# Patient Record
Sex: Male | Born: 1970
Health system: Southern US, Community
[De-identification: ages and names within clinical notes are randomized; demographics above are authoritative.]

## PROBLEM LIST (undated history)

## (undated) DIAGNOSIS — F329 Major depressive disorder, single episode, unspecified: Secondary | ICD-10-CM

## (undated) DIAGNOSIS — M722 Plantar fascial fibromatosis: Secondary | ICD-10-CM

## (undated) DIAGNOSIS — S069X9A Unspecified intracranial injury with loss of consciousness of unspecified duration, initial encounter: Secondary | ICD-10-CM

## (undated) DIAGNOSIS — G43909 Migraine, unspecified, not intractable, without status migrainosus: Secondary | ICD-10-CM

## (undated) DIAGNOSIS — F419 Anxiety disorder, unspecified: Secondary | ICD-10-CM

## (undated) DIAGNOSIS — M5090 Cervical disc disorder, unspecified, unspecified cervical region: Secondary | ICD-10-CM

## (undated) DIAGNOSIS — E785 Hyperlipidemia, unspecified: Secondary | ICD-10-CM

## (undated) DIAGNOSIS — F32A Depression, unspecified: Secondary | ICD-10-CM

## (undated) DIAGNOSIS — G4714 Hypersomnia due to medical condition: Secondary | ICD-10-CM

## (undated) DIAGNOSIS — M4320 Fusion of spine, site unspecified: Secondary | ICD-10-CM

## (undated) HISTORY — DX: Plantar fascial fibromatosis: M72.2

## (undated) HISTORY — DX: Anxiety disorder, unspecified: F41.9

## (undated) HISTORY — DX: Hyperlipidemia, unspecified: E78.5

## (undated) HISTORY — PX: WISDOM TOOTH EXTRACTION: SHX21

## (undated) HISTORY — DX: Major depressive disorder, single episode, unspecified: F32.9

## (undated) HISTORY — PX: FOOT SURGERY: SHX648

## (undated) HISTORY — DX: Migraine, unspecified, not intractable, without status migrainosus: G43.909

## (undated) HISTORY — PX: UMBILICAL HERNIA REPAIR: SHX196

## (undated) HISTORY — DX: Cervical disc disorder, unspecified, unspecified cervical region: M50.90

## (undated) HISTORY — DX: Depression, unspecified: F32.A

## (undated) HISTORY — PX: NECK SURGERY: SHX720

---

## 2013-08-23 ENCOUNTER — Encounter: Payer: Self-pay | Admitting: Physical Medicine & Rehabilitation

## 2013-09-08 ENCOUNTER — Encounter
Payer: BC Managed Care – PPO | Attending: Physical Medicine & Rehabilitation | Admitting: Physical Medicine & Rehabilitation

## 2013-09-08 ENCOUNTER — Encounter: Payer: Self-pay | Admitting: Physical Medicine & Rehabilitation

## 2013-09-08 VITALS — BP 152/87 | HR 74 | Resp 14 | Ht 72.0 in | Wt 162.0 lb

## 2013-09-08 DIAGNOSIS — Z5181 Encounter for therapeutic drug level monitoring: Secondary | ICD-10-CM

## 2013-09-08 DIAGNOSIS — M719 Bursopathy, unspecified: Secondary | ICD-10-CM | POA: Insufficient documentation

## 2013-09-08 DIAGNOSIS — M67919 Unspecified disorder of synovium and tendon, unspecified shoulder: Secondary | ICD-10-CM | POA: Insufficient documentation

## 2013-09-08 DIAGNOSIS — G478 Other sleep disorders: Secondary | ICD-10-CM | POA: Insufficient documentation

## 2013-09-08 DIAGNOSIS — R52 Pain, unspecified: Secondary | ICD-10-CM

## 2013-09-08 DIAGNOSIS — M7520 Bicipital tendinitis, unspecified shoulder: Secondary | ICD-10-CM

## 2013-09-08 DIAGNOSIS — M47812 Spondylosis without myelopathy or radiculopathy, cervical region: Secondary | ICD-10-CM

## 2013-09-08 DIAGNOSIS — M7521 Bicipital tendinitis, right shoulder: Secondary | ICD-10-CM

## 2013-09-08 DIAGNOSIS — Z79899 Other long term (current) drug therapy: Secondary | ICD-10-CM

## 2013-09-08 DIAGNOSIS — M722 Plantar fascial fibromatosis: Secondary | ICD-10-CM

## 2013-09-08 MED ORDER — DICLOFENAC SODIUM 1 % TD GEL: 1.0000 "application " | Freq: Four times a day (QID) | TRANSDERMAL | Status: DC
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
## 2013-09-08 NOTE — Patient Instructions (Signed)
I need your xray and MRI reports from [facility]  Once we receive back your urine results I will write your pain medication.    Biceps Tendon Tendinitis (Proximal) and Tenosynovitis with Rehab Tendonitis and tenosynovitis involve inflammation of the tendon and the tendon lining (sheath). The proximal biceps tendon is vulnerable to tendonitis and tenosynovitis, which causes pain and discomfort in the front of the shoulder and upper arm. The tendon lining secretes a fluid that helps lubricate the tendon, allowing for proper function without pain. When the tendon and its lining become inflamed, the tendon can no longer glide smoothly, causing pain. The proximal biceps tendon connects the biceps muscle to two bones of the shoulder. It is important for proper function of the elbow and turning the palm upward (supination) using the wrist. Proximal biceps tendon tendinitis may include a grade 1 or 2 strain of the tendon. Grade 1 strains involve a slight pull of the tendon without signs of tearing and no observed tendon lengthening. There is also no loss of strength. Grade 2 strains involve small tears in the tendon fibers. The tendon or muscle is stretched and strength is usually decreased.  SYMPTOMS   Pain, tenderness, swelling, warmth, or redness over the front of the shoulder.  Pain that gets worse with shoulder and elbow use, especially against resistance.  Limited motion of the shoulder or elbow.  Crackling sound (crepitation) when the tendon or shoulder is moved or touched. CAUSES  The symptoms of biceps tendonitis are due to inflammation of the tendon. Inflammation may be caused by:  Strain from sudden increase in amount or intensity of activity.  Direct blow or injury to the elbow (uncommon).  Overuse or repetitive elbow bending or wrist rotation, particularly when turning the palm up, or with elbow hyperextension. RISK INCREASES WITH:  Sports that involve contact or overhead arm  activity (throwing sports, gymnastics, weightlifting, bodybuilding, rock climbing).  Heavy labor.  Poor strength and flexibility.  Failure to warm up properly before activity. PREVENTION  Warm up and stretch properly before activity.  Allow time for recovery between activities.  Maintain physical fitness:  Strength, flexibility, and endurance.  Cardiovascular fitness.  Learn and use proper exercise technique. PROGNOSIS  With proper treatment, proximal biceps tendon tendonitis and tenosynovitis is usually curable within 6 weeks. Healing is usually quicker if the cause was a direct blow, not overuse.  RELATED COMPLICATIONS   Longer healing time if not properly treated or if not given enough time to heal.  Chronically inflamed tendon that causes persistent pain with activity, that may progress to constant pain and potentially rupture of the tendon.  Recurring symptoms, especially if activity is resumed too soon or with overuse, a direct blow, or use of poor exercise technique. TREATMENT Treatment first involves ice and medicine, to reduce pain and inflammation. It is helpful to modify activities that cause pain, to reduce the chances of causing the condition to get worse. Strengthening and stretching exercises should be performed to promote proper use of the muscles of the shoulder. These exercises may be performed at home or with a therapist. Other treatments may be given such as ultrasound or heat therapy. A corticosteroid injection may be recommended to help reduce inflammation of the tendon lining. Surgery is usually not necessary. Sometimes, if symptoms last for greater than 6 months, surgery will be advised to detach the tendon and re-insert it into the arm bone. Surgery to correct other shoulder problems that may be contributing to tendinitis may be  advised before surgery for the tendinitis itself.  MEDICATION  If pain medicine is needed, nonsteroidal anti-inflammatory medicines  (aspirin and ibuprofen), or other minor pain relievers (acetaminophen), are often advised.  Do not take pain medicine for 7 days before surgery.  Prescription pain relievers may be given if your caregiver thinks they are needed. Use only as directed and only as much as you need.  Corticosteroid injections may be given. These injections should only be used on the most severe cases, as one can only receive a limited number of them. HEAT AND COLD   Cold treatment (icing) should be applied for 10 to 15 minutes every 2 to 3 hours for inflammation and pain, and immediately after activity that aggravates your symptoms. Use ice packs or an ice massage.  Heat treatment may be used before performing stretching and strengthening activities prescribed by your caregiver, physical therapist, or athletic trainer. Use a heat pack or a warm water soak. SEEK MEDICAL CARE IF:   Symptoms get worse or do not improve in 2 weeks, despite treatment.  New, unexplained symptoms develop. (Drugs used in treatment may produce side effects.) EXERCISES RANGE OF MOTION (ROM) AND EXERCISES - Biceps Tendon (Proximal) and Tenosynovitis These exercises may help you when beginning to rehabilitate your injury. Your symptoms may go away with or without further involvement from your physician, physical therapist, or athletic trainer. While completing these exercises, remember:   Restoring tissue flexibility helps normal motion to return to the joints. This allows healthier, less painful movement and activity.  An effective stretch should be held for at least 30 seconds.  A stretch should never be painful. You should only feel a gentle lengthening or release in the stretched tissue. STRETCH  Flexion, Standing  Stand with good posture. With an underhand grip on your right / left hand and an overhand grip on the opposite hand, grasp a broomstick or cane so that your hands are a little more than shoulder width apart.  Keeping  your right / left elbow straight and shoulder muscles relaxed, push the stick with your opposite hand to raise your right / left arm in front of your body and then overhead. Raise your arm until you feel a stretch in your right / left shoulder, but before you have increased shoulder pain.  Try to avoid shrugging your right / left shoulder as your arm rises, by keeping your shoulder blade tucked down and toward your mid-back spine. Hold for __________ seconds.  Slowly return to the starting position. Repeat __________ times. Complete this exercise __________ times per day. STRETCH  Abduction, Supine  Lie on your back. With an underhand grip on your right / left hand and an overhand grip on the opposite hand, grasp a broomstick or cane so that your hands are a little more than shoulder width apart.  Keeping your right / left elbow straight and shoulder muscles relaxed, push the stick with your opposite hand to raise your right / left arm out to the side of your body and then overhead. Raise your arm until you feel a stretch in your right / left shoulder, but before you have increased shoulder pain.  Try to avoid shrugging your right / left shoulder as your arm rises, by keeping your shoulder blade tucked down and toward your mid-back spine. Hold for __________ seconds.  Slowly return to the starting position. Repeat __________ times. Complete this exercise __________ times per day. ROM  Flexion, Active-Assisted  Lie on your back. You  may bend your knees for comfort.  Grasp a broomstick or cane so your hands are about shoulder width apart. Your right / left hand should grip the end of the stick so that your hand is positioned "thumbs-up," as if you were about to shake hands.  Using your healthy arm to lead, raise your right / left arm overhead until you feel a gentle stretch in your shoulder. Hold for __________ seconds.  Use the stick to assist in returning your right / left arm to its starting  position. Repeat __________ times. Complete this exercise __________ times per day.  STRETCH  Flexion, Standing   Stand facing a wall. Walk your right / left fingers up the wall until you feel a moderate stretch in your shoulder. As your hand gets higher, you may need to step closer to the wall or use a door frame to walk through.  Try to avoid shrugging your right / left shoulder as your arm rises, by keeping your shoulder blade tucked down and toward your mid-back spine.  Hold for __________ seconds. Use your other hand, if needed, to ease out of the stretch and return to the starting position. Repeat __________ times. Complete this exercise __________ times per day.  ROM - Internal Rotation   Using underhand grips, grasp a stick behind your back with both hands.  While standing upright with good posture, slide the stick up your back until you feel a mild stretch in the front of your shoulder.  Hold for __________ seconds. Slowly return to your starting position. Repeat __________ times. Complete this exercise __________ times per day.  STRETCH - Internal Rotation  Place your right / left hand behind your back, palm-up.  Throw a towel or belt over your opposite shoulder. Grasp the towel with your right / left hand.  While keeping an upright posture, gently pull up on the towel until you feel a stretch in the front of your right / left shoulder.  Avoid shrugging your right / left shoulder as your arm rises, by keeping your shoulder blade tucked down and toward your mid-back spine.  Hold for __________ seconds. Release the stretch by lowering your opposite hand. Repeat __________ times. Complete this exercise __________ times per day. STRENGTHENING EXERCISES - Biceps Tendon Tendinitis (Proximal) and Tenosynovitis These exercises may help you regain your strength after your physician has discontinued your restraint in a cast or brace. They may resolve your symptoms with or without  further involvement from your physician, physical therapist or athletic trainer. While completing these exercises, remember:   Muscles can gain both the endurance and the strength needed for everyday activities through controlled exercises.  Complete these exercises as instructed by your physician, physical therapist or athletic trainer. Increase the resistance and repetitions only as guided.  You may experience muscle soreness or fatigue, but the pain or discomfort you are trying to eliminate should never worsen during these exercises. If this pain does get worse, stop and make sure you are following the directions exactly. If the pain is still present after adjustments, discontinue the exercise until you can discuss the trouble with your caregiver. STRENGTH - Elbow Flexors, Isometric  Stand or sit upright on a firm surface. Place your right / left arm so that your hand is palm-up and at the height of your waist.  Place your opposite hand on top of your forearm. Gently push down as your right / left arm resists. Push as hard as you can with both arms,  without causing any pain or movement at your right / left elbow. Hold this stationary position for __________ seconds.  Gradually release the tension in both arms. Allow your muscles to relax completely before repeating. Repeat __________ times. Complete this exercise __________ times per day. STRENGTH - Shoulder Flexion, Isometric  With good posture and facing a wall, stand or sit about 4-6 inches away.  Keeping your right / left elbow straight, gently press the top of your fist into the wall. Increase the pressure gradually until you are pressing as hard as you can, without shrugging your shoulder or increasing any shoulder discomfort.  Hold for __________ seconds.  Release the tension slowly. Relax your shoulder muscles completely before you start the next repetition. Repeat __________ times. Complete this exercise __________ times per day.    STRENGTH  Elbow Flexors, Supinated  With good posture, stand or sit on a firm chair without armrests. Allow your right / left arm to rest at your side with your palm facing forward.  Holding a __________ weight, or gripping a rubber exercise band or tubing,  bring your hand toward your shoulder.  Allow your muscles to control the resistance as your hand returns to your side. Repeat __________ times. Complete this exercise __________ times per day.  STRENGTH - Shoulder Flexion  Stand or sit with good posture. Grasp a __________ weight, or an exercise band or tubing, so that your hand is "thumbs-up," like when you shake hands.  Slowly lift your right / left arm as far as you can, without increasing any shoulder pain. At first, many people can only raise their hand to shoulder height.  Avoid shrugging your right / left shoulder as your arm rises, by keeping your shoulder blade tucked down and toward your mid-back spine.  Hold for __________ seconds. Control the descent of your hand as you slowly return to your starting position. Repeat __________ times. Complete this exercise __________ times per day. Document Released: 10/28/2005 Document Revised: 01/20/2012 Document Reviewed: 02/09/2009 [facility] Patient Information 2014 [place], [place].

## 2013-09-13 ENCOUNTER — Telehealth: Payer: Self-pay

## 2013-09-13 DIAGNOSIS — M7521 Bicipital tendinitis, right shoulder: Secondary | ICD-10-CM

## 2013-09-13 DIAGNOSIS — M47812 Spondylosis without myelopathy or radiculopathy, cervical region: Secondary | ICD-10-CM

## 2013-09-13 DIAGNOSIS — M722 Plantar fascial fibromatosis: Secondary | ICD-10-CM

## 2013-09-13 NOTE — Telephone Encounter (Signed)
Not back yet. 

## 2013-09-13 NOTE — Telephone Encounter (Signed)
Patient called and wanted to know if his UDS had came back yet and if Dr. Riley Kill could start prescribing his pain medication.

## 2013-09-14 MED ORDER — HYDROCODONE-ACETAMINOPHEN 5-325 MG PO TABS: 1.0000 | ORAL_TABLET | Freq: Three times a day (TID) | ORAL | Status: DC | PRN

## 2013-09-14 NOTE — Telephone Encounter (Signed)
Left patient a voicemail to return call to clinic to let him know his UDS has not came back yet.

## 2013-09-14 NOTE — Telephone Encounter (Signed)
rx written. He may pick up

## 2013-09-14 NOTE — Telephone Encounter (Signed)
Patient called on 11/3 and wanted to know if his UDS was back so that treatment could start. His UDS results just came in which were consistent.  Patient has been out of medication since Saturday. Please advise.

## 2013-09-14 NOTE — Telephone Encounter (Signed)
Patient is aware RX is ready for pick-up. °

## 2013-10-18 ENCOUNTER — Encounter: Payer: Self-pay | Admitting: Physical Medicine & Rehabilitation

## 2013-10-18 ENCOUNTER — Encounter
Payer: BC Managed Care – PPO | Attending: Physical Medicine & Rehabilitation | Admitting: Physical Medicine & Rehabilitation

## 2013-10-18 ENCOUNTER — Ambulatory Visit (HOSPITAL_COMMUNITY)
Admission: RE | Admit: 2013-10-18 | Discharge: 2013-10-18 | Disposition: A | Discharge status: Home / Self Care | Payer: BC Managed Care – PPO | Source: Ambulatory Visit | Attending: Physical Medicine & Rehabilitation | Admitting: Physical Medicine & Rehabilitation

## 2013-10-18 ENCOUNTER — Telehealth: Payer: Self-pay | Admitting: Physical Medicine & Rehabilitation

## 2013-10-18 VITALS — BP 164/86 | HR 87 | Resp 14 | Ht 71.0 in | Wt 172.0 lb

## 2013-10-18 DIAGNOSIS — M7521 Bicipital tendinitis, right shoulder: Secondary | ICD-10-CM

## 2013-10-18 DIAGNOSIS — M722 Plantar fascial fibromatosis: Secondary | ICD-10-CM | POA: Insufficient documentation

## 2013-10-18 DIAGNOSIS — M47812 Spondylosis without myelopathy or radiculopathy, cervical region: Secondary | ICD-10-CM

## 2013-10-18 DIAGNOSIS — M5030 Other cervical disc degeneration, unspecified cervical region: Secondary | ICD-10-CM | POA: Insufficient documentation

## 2013-10-18 DIAGNOSIS — M7520 Bicipital tendinitis, unspecified shoulder: Secondary | ICD-10-CM | POA: Insufficient documentation

## 2013-10-18 MED ORDER — FENTANYL 12 MCG/HR TD PT72: 12.5000 ug | MEDICATED_PATCH | TRANSDERMAL | Status: DC

## 2013-10-18 MED ORDER — TIZANIDINE HCL 2 MG PO CAPS: 2.0000 mg | ORAL_CAPSULE | Freq: Three times a day (TID) | ORAL | Status: DC | PRN

## 2013-10-18 MED ORDER — HYDROCODONE-ACETAMINOPHEN 5-325 MG PO TABS: 1.0000 | ORAL_TABLET | Freq: Three times a day (TID) | ORAL | Status: DC | PRN

## 2013-10-18 NOTE — Patient Instructions (Signed)
CALL ME WITH ANY PROBLEMS OR QUESTIONS (#[phone]).    HAPPY HOLIDAYS!!!!!   

## 2013-10-18 NOTE — Telephone Encounter (Signed)
Please let pt know there is mild facet arthritis on the xray. I would like him to proceed with his home traction if he still feels familiar with the use of his traction collar. i recommend 2 treatments per day 20-30 minutes per treatment. thanks

## 2013-10-18 NOTE — Progress Notes (Signed)
Subjective:    Patient ID: Alexander Douglas, male    DOB: 1971-09-10, 42 y.o.   MRN: [id number]  HPI  Alexander Douglas is back regarding his chronic pain. Pain levels have been fairly consistent. He checked at [facility] and they have "purged" his cervical images.   His feet have been stable but his neck has been worsening. The neck pain is more consistent. It bothers him a lot when he sleeps. He has developed worsening headaches as a result. He has tried ice and heat. Nothing really seems to help it.   His feet bother him more when he's working and up on them.     Pain Inventory Average Pain 7 Pain Right Now 8 My pain is sharp, burning, dull, stabbing, tingling and aching  In the last 24 hours, has pain interfered with the following? General activity 7 Relation with others 8 Enjoyment of life 9 What TIME of day is your pain at its worst? constant all day Sleep (in general) Poor  Pain is worse with: walking, bending, sitting, standing, some activites and sleeping Pain improves with: rest and medication Relief from Meds: 6  Mobility walk without assistance how many minutes can you walk? unlimited but hurts ability to climb steps?  yes do you drive?  yes transfers alone Do you have any goals in this area?  yes  Function employed # of hrs/week 12  Neuro/Psych spasms anxiety  Prior Studies Any changes since last visit?  no  Physicians involved in your care Any changes since last visit?  no   Family History  Problem Relation Age of Onset  . Diabetes Mother   . Heart disease Mother   . Hyperlipidemia Mother   . Hypertension Mother    History   Social History  . Marital Status: Unknown    Spouse Name: N/A    Number of Children: N/A  . Years of Education: N/A   Social History Main Topics  . Smoking status: Never Smoker   . Smokeless tobacco: Current User  . Alcohol Use: Yes  . Drug Use: No  . Sexual Activity: None   Other Topics Concern  . None   Social  History Narrative  . None   Past Surgical History  Procedure Laterality Date  . Foot surgery      Left 8/12 right 10/12  . Wisdom tooth extraction     Past Medical History  Diagnosis Date  . Hyperlipidemia   . Depression    BP 164/86  Pulse 87  Resp 14  Ht 5\' 11"  (1.803 m)  Wt 172 lb (78.019 kg)  BMI 24.00 kg/m2  SpO2 99%     Review of Systems  Constitutional: Positive for diaphoresis.  Neurological:       Spasms   Psychiatric/Behavioral: The patient is nervous/anxious.   All other systems reviewed and are negative.       Objective:   Physical Exam  General: Alert and oriented x 3, No apparent distress  HEENT: Head is normocephalic, atraumatic, PERRLA, EOMI, sclera anicteric, oral mucosa pink and moist, dentition intact, ext ear canals clear,  Neck: Supple without JVD or lymphadenopathy  Heart: Reg rate and rhythm. No murmurs rubs or gallops  Chest: CTA bilaterally without wheezes, rales, or rhonchi; no distress  Abdomen: Soft, non-tender, non-distended, bowel sounds positive.  Extremities: No clubbing, cyanosis, or edema. Pulses are 2+  Skin: Clean and intact without signs of breakdown  Neuro: Pt is cognitively appropriate with normal insight, memory, and awareness.  Cranial nerves 2-12 are intact. Sensory exam is normal. Reflexes are 2+ in all 4's. Fine motor coordination is intact. No tremors. Motor function is grossly 5/5.  Musculoskeletal: Pain in the right shoulder with ER.  He had pain with extension of the neck and with facet maneuvers bilaterally. Today, left more than right. . Lateral Flexion caused some tightness and pain  There was crepitus at the neck and some popping with palpation around the c5 spinous process.. Both medial arches were tender with scarring noted along the flexor tendons and arch. Both were somewhat tender to touch, left more than right. His arch was relatively preserved with gait.     Psych: Pt's affect is appropriate. Pt is cooperative      Assessment & Plan:   1. Chronic plantar fasciitis with plantar fibroids  2. Right biceps tendonitis, labral injury  3. Cervical spondylosis, ?facet arthopathy  4. Shift sleep disorder   Plan:  1. continue voltaren gel to both feet, right shoulder  2. Will xray the neck to assess disk spaces and facets.  3. Will add fentanyl for better pain control. He may continue with the hydrocodone for breakthrough. 4. Continue exercising. Will potentially use traction unit he has pending xray results.  5. Will change robaxin to zanaflex to help with sleep and neck spasm.  6. Prn naproxen  7. Follow up with me in one month. 30 minutes of face to face patient care time were spent during this visit. All questions were encouraged and answered.

## 2013-10-19 NOTE — Telephone Encounter (Signed)
Left patient a voicemail to return call to clinic regarding X-Ray.

## 2013-10-20 ENCOUNTER — Telehealth: Payer: Self-pay

## 2013-10-20 NOTE — Telephone Encounter (Signed)
Returned call to clinic. Left patient another voicemail to return call.

## 2013-10-21 NOTE — Telephone Encounter (Signed)
Informed patient of X-Ray results. 

## 2013-11-05 ENCOUNTER — Telehealth: Payer: Self-pay

## 2013-11-05 NOTE — Telephone Encounter (Signed)
Patient called because he will be out of his medication before his next appointment.

## 2013-11-08 NOTE — Telephone Encounter (Signed)
Left a voicemail to return call to clinic to see if patient to see if he could come in to be seen today since he will be out of medication before his appt.

## 2013-11-09 ENCOUNTER — Encounter: Payer: Self-pay | Admitting: Physical Medicine & Rehabilitation

## 2013-11-09 ENCOUNTER — Encounter
Payer: BC Managed Care – PPO | Attending: Physical Medicine & Rehabilitation | Admitting: Physical Medicine & Rehabilitation

## 2013-11-09 VITALS — BP 140/84 | HR 93 | Resp 14 | Ht 71.0 in | Wt 167.0 lb

## 2013-11-09 DIAGNOSIS — M7521 Bicipital tendinitis, right shoulder: Secondary | ICD-10-CM

## 2013-11-09 DIAGNOSIS — M719 Bursopathy, unspecified: Secondary | ICD-10-CM | POA: Insufficient documentation

## 2013-11-09 DIAGNOSIS — M67919 Unspecified disorder of synovium and tendon, unspecified shoulder: Secondary | ICD-10-CM | POA: Insufficient documentation

## 2013-11-09 DIAGNOSIS — G4726 Circadian rhythm sleep disorder, shift work type: Secondary | ICD-10-CM | POA: Insufficient documentation

## 2013-11-09 DIAGNOSIS — D2370 Other benign neoplasm of skin of unspecified lower limb, including hip: Secondary | ICD-10-CM | POA: Insufficient documentation

## 2013-11-09 DIAGNOSIS — M722 Plantar fascial fibromatosis: Secondary | ICD-10-CM | POA: Insufficient documentation

## 2013-11-09 DIAGNOSIS — M7520 Bicipital tendinitis, unspecified shoulder: Secondary | ICD-10-CM

## 2013-11-09 DIAGNOSIS — M47812 Spondylosis without myelopathy or radiculopathy, cervical region: Secondary | ICD-10-CM | POA: Insufficient documentation

## 2013-11-09 MED ORDER — FENTANYL 25 MCG/HR TD PT72: 25.0000 ug | MEDICATED_PATCH | TRANSDERMAL | Status: DC

## 2013-11-09 MED ORDER — HYDROCODONE-ACETAMINOPHEN 5-325 MG PO TABS: 1.0000 | ORAL_TABLET | Freq: Three times a day (TID) | ORAL | Status: DC | PRN

## 2013-11-09 NOTE — Patient Instructions (Signed)
TRY USING YOUR TRACTION ABOUT PER DAY.    CONTINUE TO WORK ON YOUR POSTURE AND HEAD/NECK POSITION.

## 2013-11-09 NOTE — Progress Notes (Signed)
Subjective:    Patient ID: Alexander Douglas, male    DOB: 01-29-1971, 42 y.o.   MRN: [id number]  HPI  Mr. Mckibben is back regarding his chronic foot and neck pain. He has felt that the fentanyl has been helpful but the relief hasn't been the entire 72 hours---often pain relief was only a day to 1.5 days. He has started using his cervical traction unit which has helped to a degree. The zanaflex has been much better for sleep and muscle relaxation at night as opposed to the robaxin. Alleve is benefical.   His cervical xrays showed: Mild foraminal narrowing bilaterally at C4-5 related to facet  hypertrophy. Mild anterior slip C4-5 related facet degeneration.  Negative for spondylosis or fracture.    Pain Inventory Average Pain 6 Pain Right Now 6 My pain is sharp, dull, stabbing, tingling and aching  In the last 24 hours, has pain interfered with the following? General activity 6 Relation with others 7 Enjoyment of life 8 What TIME of day is your pain at its worst? all the time Sleep (in general) Fair  Pain is worse with: standing and some activites Pain improves with: heat/ice, therapy/exercise and medication Relief from Meds: 5  Mobility walk without assistance ability to climb steps?  yes do you drive?  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  no   Family History  Problem Relation Age of Onset  . Diabetes Mother   . Heart disease Mother   . Hyperlipidemia Mother   . Hypertension Mother    History   Social History  . Marital Status: Unknown    Spouse Name: N/A    Number of Children: N/A  . Years of Education: N/A   Social History Main Topics  . Smoking status: Never Smoker   . Smokeless tobacco: Current User  . Alcohol Use: Yes  . Drug Use: No  . Sexual Activity: None   Other Topics Concern  .  None   Social History Narrative  . None   Past Surgical History  Procedure Laterality Date  . Foot surgery      Left 8/12 right 10/12  . Wisdom tooth extraction     Past Medical History  Diagnosis Date  . Hyperlipidemia   . Depression    BP 140/84  Pulse 93  Resp 14  Ht 5\' 11"  (1.803 m)  Wt 167 lb (75.751 kg)  BMI 23.30 kg/m2  SpO2 100%     Review of Systems  Constitutional: Positive for unexpected weight change.  Musculoskeletal: Positive for arthralgias and myalgias.  Neurological: Positive for numbness.  Psychiatric/Behavioral: The patient is nervous/anxious.   All other systems reviewed and are negative.       Objective:   Physical Exam  General: Alert and oriented x 3, No apparent distress  HEENT: Head is normocephalic, atraumatic, PERRLA, EOMI, sclera anicteric, oral mucosa pink and moist, dentition intact, ext ear canals clear,  Neck: Supple without JVD or lymphadenopathy  Heart: Reg rate and rhythm. No murmurs rubs or gallops  Chest: CTA bilaterally without wheezes, rales, or rhonchi; no distress  Abdomen: Soft, non-tender, non-distended, bowel sounds positive.  Extremities: No clubbing, cyanosis, or edema. Pulses are 2+  Skin: Clean and intact without signs of breakdown  Neuro: Pt is cognitively appropriate with normal insight, memory, and awareness. Cranial nerves 2-12 are intact. Sensory  exam is normal. Reflexes are 2+ in all 4's. Fine motor coordination is intact. No tremors. Motor function is grossly 5/5.  Musculoskeletal: Posture was better today. Has less of a head forward position. Cervical musclature less tense although there still is some tightness in the mid traps. Less crepitus and pain with palpation today. Shoulders in better position. Both medial arches remain tender with scarring noted along the flexor tendons and arch. Both were somewhat tender to touch, left more than right. His arch was relatively preserved with gait.  Psych: Pt's affect is  appropriate. Pt is cooperative   Assessment & Plan:   1. Chronic plantar fasciitis with plantar fibroids  2. Right biceps tendonitis, labral injury  3. Cervical spondylosis, ?facet arthopathy  4. Shift sleep disorder    Plan:  1. continue voltaren gel to both feet, right shoulder  2. Encouraged the use of his own traction unit, perhaps going up to 30 minutes per day.  3. Will increase fentanyl to to see if he has better efficacy 4. Reviewed posture and ongoing maintenance activities, rest breaks at work, etc.  5. Continue with zanaflex before sleep. Consider use during the day.   6. Prn naproxen  7. Follow up with me in one month. 30 minutes of face to face patient care time were spent during this visit. All questions were encouraged and answered.

## 2013-11-24 ENCOUNTER — Ambulatory Visit: Payer: BC Managed Care – PPO | Admitting: Physical Medicine & Rehabilitation

## 2013-11-24 ENCOUNTER — Telehealth: Payer: Self-pay

## 2013-11-24 NOTE — Telephone Encounter (Signed)
Patient said he is having a lot of neck pain due to work, so he was wondering if he could use his TENS unit?

## 2013-11-24 NOTE — Telephone Encounter (Signed)
That would be fine 

## 2013-11-25 NOTE — Telephone Encounter (Signed)
Left patient a voicemail informing him that its was okay to use his TENS unit per Dr. Naaman Plummer and if he had any questions to contact the clinic.

## 2013-12-08 ENCOUNTER — Encounter: Payer: Self-pay | Admitting: Physical Medicine & Rehabilitation

## 2013-12-08 ENCOUNTER — Encounter
Payer: BC Managed Care – PPO | Attending: Physical Medicine & Rehabilitation | Admitting: Physical Medicine & Rehabilitation

## 2013-12-08 VITALS — BP 157/93 | HR 71 | Resp 14 | Ht 71.0 in | Wt 170.0 lb

## 2013-12-08 DIAGNOSIS — M47812 Spondylosis without myelopathy or radiculopathy, cervical region: Secondary | ICD-10-CM

## 2013-12-08 DIAGNOSIS — M7520 Bicipital tendinitis, unspecified shoulder: Secondary | ICD-10-CM

## 2013-12-08 DIAGNOSIS — IMO0001 Reserved for inherently not codable concepts without codable children: Secondary | ICD-10-CM

## 2013-12-08 DIAGNOSIS — M7918 Myalgia, other site: Secondary | ICD-10-CM | POA: Insufficient documentation

## 2013-12-08 DIAGNOSIS — M722 Plantar fascial fibromatosis: Secondary | ICD-10-CM

## 2013-12-08 DIAGNOSIS — M7521 Bicipital tendinitis, right shoulder: Secondary | ICD-10-CM

## 2013-12-08 MED ORDER — FENTANYL 25 MCG/HR TD PT72: 25.0000 ug | MEDICATED_PATCH | TRANSDERMAL | Status: DC

## 2013-12-08 MED ORDER — HYDROCODONE-ACETAMINOPHEN 5-325 MG PO TABS: 1.0000 | ORAL_TABLET | Freq: Three times a day (TID) | ORAL | Status: DC | PRN

## 2013-12-08 NOTE — Progress Notes (Signed)
Subjective:    Patient ID: Alexander Douglas, male    DOB: 13-Jul-1971, 43 y.o.   MRN: [id number]  HPI  Alexander Douglas is back regarding his multiple pain complaints. He is working 50 hours per week currently and has been doing a lot of overhead work, standing on ladders, etc.  He has been using his collar daily basis, typically 20-30 minutes per day, over the last month. He feels that it helps although it's a little painful at first when it's on his neck. He's found that his TENS unit is helpful as well.    He's tried some boots more recently which seemed to help his feet.    Pain Inventory Average Pain 6 Pain Right Now 8 My pain is sharp, burning, dull, stabbing, aching and other  In the last 24 hours, has pain interfered with the following? General activity 6 Relation with others 7 Enjoyment of life 9 What TIME of day is your pain at its worst? morning, evening, night Sleep (in general) Poor  Pain is worse with: walking, bending, inactivity, standing, some activites and other Pain improves with: rest, heat/ice, therapy/exercise, pacing activities, medication and TENS Relief from Meds: 6  Mobility walk without assistance how many minutes can you walk? a lot with pain though ability to climb steps?  yes do you drive?  yes  Function employed # of hrs/week 50+ I need assistance with the following:  household duties Do you have any goals in this area?  yes  Neuro/Psych numbness trouble walking spasms anxiety  Prior Studies Any changes since last visit?  yes x-rays  Physicians involved in your care Any changes since last visit?  no   Family History  Problem Relation Age of Onset  . Diabetes Mother   . Heart disease Mother   . Hyperlipidemia Mother   . Hypertension Mother    History   Social History  . Marital Status: Unknown    Spouse Name: N/A    Number of Children: N/A  . Years of Education: N/A   Social History Main Topics  . Smoking status: Never Smoker     . Smokeless tobacco: Current User  . Alcohol Use: Yes  . Drug Use: No  . Sexual Activity: None   Other Topics Concern  . None   Social History Narrative  . None   Past Surgical History  Procedure Laterality Date  . Foot surgery      Left 8/12 right 10/12  . Wisdom tooth extraction     Past Medical History  Diagnosis Date  . Hyperlipidemia   . Depression    BP 157/93  Pulse 71  Resp 14  Ht 5\' 11"  (1.803 m)  Wt 170 lb (77.111 kg)  BMI 23.72 kg/m2  SpO2 100%  Opioid Risk Score: 5 Fall Risk Score:  (pt educated on fall risk, brochure given to pt.)    Review of Systems  Constitutional: Positive for diaphoresis.  Musculoskeletal: Positive for gait problem and neck pain.  Neurological: Positive for numbness.       Spasms  Psychiatric/Behavioral: The patient is nervous/anxious.   All other systems reviewed and are negative.       Objective:   Physical Exam  General: Alert and oriented x 3, No apparent distress  HEENT: Head is normocephalic, atraumatic, PERRLA, EOMI, sclera anicteric, oral mucosa pink and moist, dentition intact, ext ear canals clear,  Neck: Supple without JVD or lymphadenopathy  Heart: Reg rate and rhythm. No murmurs rubs or gallops  Chest: CTA bilaterally without wheezes, rales, or rhonchi; no distress  Abdomen: Soft, non-tender, non-distended, bowel sounds positive.  Extremities: No clubbing, cyanosis, or edema. Pulses are 2+  Skin: Clean and intact without signs of breakdown  Neuro: Pt is cognitively appropriate with normal insight, memory, and awareness. Cranial nerves 2-12 are intact. Sensory exam is normal. Reflexes are 2+ in all 4's. Fine motor coordination is intact. No tremors. Motor function is grossly 5/5.  Musculoskeletal: Posture was still in a head forward position. Cervical musclature less tense. Shoulders are rotated internally quite a bit today.   . Both medial arches remain tender with scarring noted along the flexor tendons and  arch. Both were somewhat tender to touch, left more than right. His arch was relatively preserved with gait.  Psych: Pt's affect is appropriate. Pt is cooperative    Assessment & Plan:   1. Chronic plantar fasciitis with plantar fibroids  2. Right biceps tendonitis, labral injury  3. Cervical spondylosis, ?facet arthopathy  4. Shift sleep disorder   Plan:  1. Continue voltaren gel to both feet, right shoulder  2. Encouraged the use of his own traction unit at home. May benefit from using his zanaflex before. He's not real anxious to pursue any injections for his neck. 3. Continue fentanyl to 71mcg and hydrocodone. rx'es were provide for this and next month.    4. Needs to continue working on posture and regular stretching, technique. I believe this is his best option. He tends to fall into bad habits especially when he's tired.   5. Continue with zanaflex before sleep. Consider use during the day--we discussed this again today.  6. Naproxen 2 tabs BID with food. 7. Follow up with me in 2 months. 30 minutes of face to face patient care time were spent during this visit. All questions were encouraged and answered.            Assessment & Plan:

## 2013-12-08 NOTE — Patient Instructions (Signed)
CONTINUE TO WORK ON YOUR POSTURE, STRETCHING  TAKE YOUR ZANAFLEX A LITTLE MORE OFTEN

## 2014-02-01 ENCOUNTER — Encounter: Payer: BC Managed Care – PPO | Admitting: Physical Medicine & Rehabilitation

## 2014-02-02 ENCOUNTER — Other Ambulatory Visit: Payer: Self-pay | Admitting: *Deleted

## 2014-02-02 DIAGNOSIS — M7521 Bicipital tendinitis, right shoulder: Secondary | ICD-10-CM

## 2014-02-02 DIAGNOSIS — M47812 Spondylosis without myelopathy or radiculopathy, cervical region: Secondary | ICD-10-CM

## 2014-02-02 DIAGNOSIS — M722 Plantar fascial fibromatosis: Secondary | ICD-10-CM

## 2014-02-02 MED ORDER — FENTANYL 25 MCG/HR TD PT72: 25.0000 ug | MEDICATED_PATCH | TRANSDERMAL | Status: DC

## 2014-02-02 MED ORDER — HYDROCODONE-ACETAMINOPHEN 5-325 MG PO TABS: 1.0000 | ORAL_TABLET | Freq: Three times a day (TID) | ORAL | Status: DC | PRN

## 2014-02-02 NOTE — Telephone Encounter (Signed)
rx printed for MD to sign for RN visit 02/03/14

## 2014-02-07 ENCOUNTER — Encounter: Payer: BC Managed Care – PPO | Attending: Physical Medicine & Rehabilitation | Admitting: *Deleted

## 2014-02-07 ENCOUNTER — Other Ambulatory Visit: Payer: Self-pay | Admitting: *Deleted

## 2014-02-07 ENCOUNTER — Telehealth: Payer: Self-pay | Admitting: *Deleted

## 2014-02-07 ENCOUNTER — Encounter: Payer: Self-pay | Admitting: *Deleted

## 2014-02-07 VITALS — BP 137/78 | HR 97 | Resp 14

## 2014-02-07 DIAGNOSIS — M7520 Bicipital tendinitis, unspecified shoulder: Secondary | ICD-10-CM | POA: Insufficient documentation

## 2014-02-07 DIAGNOSIS — Z79899 Other long term (current) drug therapy: Secondary | ICD-10-CM | POA: Insufficient documentation

## 2014-02-07 DIAGNOSIS — Z5181 Encounter for therapeutic drug level monitoring: Secondary | ICD-10-CM

## 2014-02-07 DIAGNOSIS — M7521 Bicipital tendinitis, right shoulder: Secondary | ICD-10-CM

## 2014-02-07 DIAGNOSIS — M47812 Spondylosis without myelopathy or radiculopathy, cervical region: Secondary | ICD-10-CM

## 2014-02-07 DIAGNOSIS — M722 Plantar fascial fibromatosis: Secondary | ICD-10-CM | POA: Insufficient documentation

## 2014-02-07 MED ORDER — HYDROCODONE-ACETAMINOPHEN 5-325 MG PO TABS: 1.0000 | ORAL_TABLET | Freq: Three times a day (TID) | ORAL | Status: DC | PRN

## 2014-02-07 MED ORDER — FENTANYL 25 MCG/HR TD PT72: 25.0000 ug | MEDICATED_PATCH | TRANSDERMAL | Status: DC

## 2014-02-07 MED ORDER — TIZANIDINE HCL 2 MG PO CAPS: 2.0000 mg | ORAL_CAPSULE | Freq: Three times a day (TID) | ORAL | Status: DC | PRN

## 2014-02-07 NOTE — Patient Instructions (Signed)
Follow up for med refill and then Dr Naaman Plummer in 2 months

## 2014-02-07 NOTE — Telephone Encounter (Signed)
He has a traction unit as I've written in my office note. I'm not opposed to going to a q48 hour schedule on his patch. We can change him at next refill or sooner if he changes to 48 houea now

## 2014-02-07 NOTE — Telephone Encounter (Signed)
RX for fentanyl 25 mcg and hydrocodone 5/325  reprinted for Danella Sensing NP to sign for RN med refill visit 02/07/14 for

## 2014-02-07 NOTE — Progress Notes (Signed)
Here for pill count and medication refills. Fentanyl 25 mcg # 10 fill date 01/07/14  Today NV# 0. Hydrocodone 5/325 # 90 Fill date  01/15/14  Today NV#23   VSS  Alexander Douglas comes in talking about how he is not sure if it is because he has a high metabolism or what but he thinks that if he could change his patch every 2 days instead of every 3 that he would do much better.  He says the med is wearing off and he is left with more pain on the third day and his fiance says he is a "bear".   He was previously on 12 mcg and Dr Naaman Plummer increased him to 25 mcg.  He is also talking about his back pain. He will have to discuss this further with Dr Naaman Plummer.  He had an appt last week with him but had to reschedule.  He will come back in a month for a med refill and 2 months with Dr Naaman Plummer.  I will send him a message about Alexander Dollins request to be increased to q 48 instead of q 72 on his Fentanyl and see what Dr Angelique Holm advises.

## 2014-02-07 NOTE — Telephone Encounter (Signed)
Alexander Douglas was in today for his med refill.  He is complaining of the fentanyl wearing off too soon.  By the third day he is in a great deal of pain.  He feels like he has a fast metabolism and it may be causing it to wear off quicker.  He is wanting to change his patch every 2 days instead of every three.  I am not familiar with this patient and what his problems are.  He talks about is neck and his back and he has some sort of traction he is using at home on his neck, but there was no MRIs on back or neck in system to corroborate his description of pain.  You increased him from 12 mcg to 25 mcg. Do you think he needs to go to q 48 hour changes instead of q 72? He will not be able to see you probably for 2 months because of schedule. Please advise.

## 2014-02-08 ENCOUNTER — Telehealth: Payer: Self-pay

## 2014-02-08 MED ORDER — FENTANYL 25 MCG/HR TD PT72: 25.0000 ug | MEDICATED_PATCH | TRANSDERMAL | Status: DC

## 2014-02-08 NOTE — Telephone Encounter (Signed)
Left message for patient to call office regarding his fentanyl rx change.  A new rx has been printed and signed so he can use the patches every 48hrs.

## 2014-02-08 NOTE — Telephone Encounter (Signed)
Patient was given a refill on his fentanyl 02/07/2014.  We will give him another 5 patches so he can change them every 48 hours.  Rx printed to be signed.  Will contact patient when ready.

## 2014-02-08 NOTE — Telephone Encounter (Signed)
Patient returned call to clinic. He is aware that his Fentanyl RX is ready for pickup.

## 2014-02-09 NOTE — Telephone Encounter (Signed)
Patient returned call to clinic. Informed him per Dr. Naaman Plummer he could use Fentanyl patches every 48 hours and that a new RX was ready for pickup.

## 2014-02-18 NOTE — Progress Notes (Signed)
Urine drug screen was positive for alcohol making it inconsistent.

## 2014-03-08 ENCOUNTER — Encounter: Payer: Self-pay | Admitting: Registered Nurse

## 2014-03-08 ENCOUNTER — Encounter: Payer: BC Managed Care – PPO | Attending: Physical Medicine & Rehabilitation | Admitting: Registered Nurse

## 2014-03-08 VITALS — BP 131/84 | HR 76 | Resp 14 | Ht 71.0 in | Wt 157.0 lb

## 2014-03-08 DIAGNOSIS — M7521 Bicipital tendinitis, right shoulder: Secondary | ICD-10-CM

## 2014-03-08 DIAGNOSIS — M47812 Spondylosis without myelopathy or radiculopathy, cervical region: Secondary | ICD-10-CM

## 2014-03-08 DIAGNOSIS — IMO0001 Reserved for inherently not codable concepts without codable children: Secondary | ICD-10-CM

## 2014-03-08 DIAGNOSIS — M722 Plantar fascial fibromatosis: Secondary | ICD-10-CM | POA: Insufficient documentation

## 2014-03-08 DIAGNOSIS — M7520 Bicipital tendinitis, unspecified shoulder: Secondary | ICD-10-CM | POA: Insufficient documentation

## 2014-03-08 DIAGNOSIS — M7918 Myalgia, other site: Secondary | ICD-10-CM

## 2014-03-08 MED ORDER — HYDROCODONE-ACETAMINOPHEN 5-325 MG PO TABS: 1.0000 | ORAL_TABLET | Freq: Three times a day (TID) | ORAL | Status: DC | PRN

## 2014-03-08 MED ORDER — DICLOFENAC SODIUM 1 % TD GEL: 1.0000 "application " | Freq: Four times a day (QID) | TRANSDERMAL | Status: DC

## 2014-03-08 MED ORDER — FENTANYL 25 MCG/HR TD PT72: 25.0000 ug | MEDICATED_PATCH | TRANSDERMAL | Status: DC

## 2014-03-08 MED ORDER — TIZANIDINE HCL 2 MG PO CAPS: 2.0000 mg | ORAL_CAPSULE | Freq: Three times a day (TID) | ORAL | Status: DC | PRN

## 2014-03-08 NOTE — Progress Notes (Signed)
Subjective:    Patient ID: Alexander Douglas, male    DOB: 01/18/1971, 43 y.o.   MRN: [id number]  HPI: [name] is a 43 year old male who returns for follow up for chronic pain and medication refill.He says he's having neck and lower back pain. He notices he's having a lot of popping in his neck. Today he's not having any neck pain or popping. He rates his pain 7. He uses heat and ice therapy therapy also uses his traction, TENS Unit and posture corrector.  He says current exercise regime is his physical labor at work. Pain Inventory Average Pain 7 Pain Right Now 7 My pain is sharp, burning, dull, stabbing and aching  In the last 24 hours, has pain interfered with the following? General activity 6 Relation with others 8 Enjoyment of life 9 What TIME of day is your pain at its worst? morning,evening Sleep (in general) Fair  Pain is worse with: bending, standing, unsure, some activites and other Pain improves with: heat/ice, therapy/exercise, medication and TENS Relief from Meds: 7  Mobility walk without assistance how many minutes can you walk? a lot but with pain ability to climb steps?  yes do you drive?  yes  Function employed # of hrs/week 50+  Neuro/Psych numbness tingling spasms anxiety  Prior Studies Any changes since last visit?  no  Physicians involved in your care Any changes since last visit?  no   Family History  Problem Relation Age of Onset  . Diabetes Mother   . Heart disease Mother   . Hyperlipidemia Mother   . Hypertension Mother    History   Social History  . Marital Status: Unknown    Spouse Name: N/A    Number of Children: N/A  . Years of Education: N/A   Social History Main Topics  . Smoking status: Never Smoker   . Smokeless tobacco: Current User  . Alcohol Use: Yes  . Drug Use: No  . Sexual Activity: None   Other Topics Concern  . None   Social History Narrative  . None   Past Surgical History  Procedure  Laterality Date  . Foot surgery      Left 8/12 right 10/12  . Wisdom tooth extraction     Past Medical History  Diagnosis Date  . Hyperlipidemia   . Depression    BP 131/84  Pulse 76  Resp 14  Ht 5\' 11"  (1.803 m)  Wt 157 lb (71.215 kg)  BMI 21.91 kg/m2  SpO2 100%  Opioid Risk Score:   Fall Risk Score: Low Fall Risk (0-5 points) (pt educated and given a brochure on fall risk previously)    Review of Systems  Constitutional: Positive for diaphoresis.  Musculoskeletal: Positive for back pain and neck pain.  Neurological: Positive for numbness.       Tingling, spasms  Psychiatric/Behavioral: The patient is nervous/anxious.   All other systems reviewed and are negative.      Objective:   Physical Exam  Nursing note and vitals reviewed. Constitutional: He appears well-developed and well-nourished.  HENT:  Head: Normocephalic and atraumatic.  Neck: Normal range of motion. Neck supple.  Cervical paraspinal Tenderness: C-2- C-4  Cardiovascular: Normal rate and regular rhythm.   Pulmonary/Chest: Effort normal and breath sounds normal.  Musculoskeletal:  Normal Muscle Bulk: Muscle Testing Reveals: Upper and Lower Extremities with Full Range of Motion, Muscle Strength 5/5. Lumbar Paraspinal Tenderness: L-4-L-5          Assessment &  Plan:  1. Cervical Spondylosis: Refilled: Fentanyl Patches one patch every other day. #15 and HYDROcodone 5/325mg  one tablet three times a day as needed #90. Continue with heat and ice therapy, Posture corrector and TENS UNIT.  2.Lumbago: Continue current medication regime, using Voltaren gel and Zanaflex  30 minutes of face to face patient care was spent during this visit. All questions were encouraged and answered.

## 2014-03-09 ENCOUNTER — Telehealth: Payer: Self-pay | Admitting: Registered Nurse

## 2014-03-09 DIAGNOSIS — M47812 Spondylosis without myelopathy or radiculopathy, cervical region: Secondary | ICD-10-CM

## 2014-03-09 NOTE — Telephone Encounter (Signed)
I spoke with Alexander Douglas after speaking with Dr. Naaman Plummer. We will order a CT Myelogram.

## 2014-04-08 ENCOUNTER — Encounter
Payer: BC Managed Care – PPO | Attending: Physical Medicine & Rehabilitation | Admitting: Physical Medicine & Rehabilitation

## 2014-04-08 ENCOUNTER — Encounter: Payer: Self-pay | Admitting: Physical Medicine & Rehabilitation

## 2014-04-08 VITALS — BP 146/83 | HR 85 | Resp 14 | Ht 71.0 in | Wt 161.0 lb

## 2014-04-08 DIAGNOSIS — IMO0001 Reserved for inherently not codable concepts without codable children: Secondary | ICD-10-CM

## 2014-04-08 DIAGNOSIS — M7521 Bicipital tendinitis, right shoulder: Secondary | ICD-10-CM

## 2014-04-08 DIAGNOSIS — M7918 Myalgia, other site: Secondary | ICD-10-CM

## 2014-04-08 DIAGNOSIS — M722 Plantar fascial fibromatosis: Secondary | ICD-10-CM

## 2014-04-08 DIAGNOSIS — M47812 Spondylosis without myelopathy or radiculopathy, cervical region: Secondary | ICD-10-CM

## 2014-04-08 DIAGNOSIS — M7520 Bicipital tendinitis, unspecified shoulder: Secondary | ICD-10-CM

## 2014-04-08 MED ORDER — FENTANYL 25 MCG/HR TD PT72: 25.0000 ug | MEDICATED_PATCH | TRANSDERMAL | Status: DC

## 2014-04-08 MED ORDER — HYDROCODONE-ACETAMINOPHEN 5-325 MG PO TABS: 1.0000 | ORAL_TABLET | Freq: Three times a day (TID) | ORAL | Status: DC | PRN

## 2014-04-08 NOTE — Progress Notes (Signed)
Subjective:    Patient ID: Alexander Douglas, male    DOB: Apr 15, 1971, 43 y.o.   MRN: [id number]  HPI  Alexander Douglas is back regarding his chronic cervical pain. He has had some relief with the fentanyl patch dosed at q48 hours. However, he's still having popping/pain with rom, particularly with rotation in the neck. The pain is more so in the central neck and right lower cervical spine into the area of his right trap.   He continues on his hydrocodone,  alleve, and zanaflex  He has reported awakening in the morning with his hands numb (transient). He denies pain in his arms.   Ein also reports increased low back pain. He wonders if it's something related to him favoring his neck while at work. The pain is non-radiating and worse the longer he stands or works.   Pain Inventory Average Pain 7 Pain Right Now 8 My pain is intermittent, sharp, burning, dull and other  In the last 24 hours, has pain interfered with the following? General activity 9 Relation with others 9 Enjoyment of life 9 What TIME of day is your pain at its worst? morning, evening Sleep (in general) Poor  Pain is worse with: bending, some activites and other Pain improves with: heat/ice, therapy/exercise, medication and TENS Relief from Meds: 6  Mobility walk without assistance how many minutes can you walk? plenty ability to climb steps?  yes do you drive?  yes transfers alone  Function employed # of hrs/week 52  Neuro/Psych numbness tingling spasms anxiety  Prior Studies Any changes since last visit?  no  Physicians involved in your care Any changes since last visit?  no   Family History  Problem Relation Age of Onset  . Diabetes Mother   . Heart disease Mother   . Hyperlipidemia Mother   . Hypertension Mother    History   Social History  . Marital Status: Unknown    Spouse Name: N/A    Number of Children: N/A  . Years of Education: N/A   Social History Main Topics  . Smoking status:  Never Smoker   . Smokeless tobacco: Current User  . Alcohol Use: Yes  . Drug Use: No  . Sexual Activity: None   Other Topics Concern  . None   Social History Narrative  . None   Past Surgical History  Procedure Laterality Date  . Foot surgery      Left 8/12 right 10/12  . Wisdom tooth extraction     Past Medical History  Diagnosis Date  . Hyperlipidemia   . Depression    BP 146/83  Pulse 85  Resp 14  Ht 5\' 11"  (1.803 m)  Wt 161 lb (73.029 kg)  BMI 22.46 kg/m2  SpO2 100%  Opioid Risk Score:   Fall Risk Score: Low Fall Risk (0-5 points) (pt educated on fall risk, brochure given to pt previously)    Review of Systems  Constitutional: Positive for diaphoresis.  Musculoskeletal: Positive for back pain.  Neurological: Positive for numbness.       Tingling, spasms  Psychiatric/Behavioral: The patient is nervous/anxious.   All other systems reviewed and are negative.      Objective:   Physical Exam   General: Alert and oriented x 3, No apparent distress  HEENT: Head is normocephalic, atraumatic, PERRLA, EOMI, sclera anicteric, oral mucosa pink and moist, dentition intact, ext ear canals clear,  Neck: Supple without JVD or lymphadenopathy  Heart: Reg rate and rhythm. No murmurs rubs  or gallops  Chest: CTA bilaterally without wheezes, rales, or rhonchi; no distress  Abdomen: Soft, non-tender, non-distended, bowel sounds positive.  Extremities: No clubbing, cyanosis, or edema. Pulses are 2+  Skin: Clean and intact without signs of breakdown  Neuro: Pt is cognitively appropriate with normal insight, memory, and awareness. Cranial nerves 2-12 are intact. Sensory exam is normal. Reflexes are 2+ in all 4's. Fine motor coordination is intact. No tremors. Motor function is grossly 5/5.  Musculoskeletal: Posture was still in a head forward position. Traps are tight, particularly the right. He has crepitus in the neck with rotation, right more than left. Right rotation more  limited.  Both medial arches remain tender with scarring noted along the flexor tendons and arch. Both were somewhat tender to touch, left more than right. His arch was relatively preserved with gait.  Psych: Pt's affect is appropriate. Pt is cooperative     Assessment & Plan:   1. Chronic plantar fasciitis with plantar fibroids  2. Right biceps tendonitis, labral injury  3. Cervical spondylosis, ?facet arthopathy  4. Shift sleep disorder    Plan:  1. Continue voltaren gel to both feet, right shoulder  2. CT myelogram scheduled for Monday after which we can decide on plan for interventions, etc.  3. Continue fentanyl to 24mcg q48 hours and hydrocodone. 4. Reviewed the effects of his job ultimately on his pain. He is aware that he can't keep this work up indefinitely  5. Continue with zanaflex before sleep. Unable to tolerate during day. 6. Naproxen 2 tabs BID with food. Discussed changes in timing today.  7. Follow up with me or NP in about 1 month. 30 minutes of face to face patient care time were spent during this visit. All questions were encouraged and answered.

## 2014-04-08 NOTE — Patient Instructions (Signed)
BACK OF YOUR TRACTION INTENSITY/TENSION FOR NOW IF IT'S CAUSING MORE PAIN.

## 2014-04-11 ENCOUNTER — Ambulatory Visit
Admission: RE | Admit: 2014-04-11 | Discharge: 2014-04-11 | Disposition: A | Discharge status: Home / Self Care | Payer: BC Managed Care – PPO | Source: Ambulatory Visit | Attending: Registered Nurse | Admitting: Registered Nurse

## 2014-04-11 VITALS — BP 122/73 | HR 65

## 2014-04-11 DIAGNOSIS — M47812 Spondylosis without myelopathy or radiculopathy, cervical region: Secondary | ICD-10-CM

## 2014-04-11 MED ORDER — HYDROMORPHONE HCL PF 2 MG/ML IJ SOLN
1.5000 mg | Freq: Once | INTRAMUSCULAR | Status: AC
  Administered 2014-04-11: 1.5 mg via INTRAMUSCULAR

## 2014-04-11 MED ORDER — DIAZEPAM 5 MG PO TABS
10.0000 mg | ORAL_TABLET | Freq: Once | ORAL | Status: AC
  Administered 2014-04-11: 10 mg via ORAL

## 2014-04-11 MED ORDER — IOHEXOL 300 MG/ML  SOLN
9.0000 mL | Freq: Once | INTRAMUSCULAR | Status: AC | PRN
  Administered 2014-04-11: 9 mL via INTRATHECAL

## 2014-04-11 MED ORDER — ONDANSETRON HCL 4 MG/2ML IJ SOLN
4.0000 mg | Freq: Once | INTRAMUSCULAR | Status: AC
  Administered 2014-04-11: 4 mg via INTRAMUSCULAR

## 2014-04-11 NOTE — Progress Notes (Signed)
Pt states he has been off Wellbutrin for the past 2 days. Discharge instructions explained to pt.

## 2014-04-11 NOTE — Progress Notes (Signed)
Heather May at patient's bedside in nursing station after myelogram.  jkl

## 2014-04-11 NOTE — Discharge Instructions (Signed)
Myelogram Discharge Instructions  1. Go home and rest quietly for the next 24 hours.  It is important to lie flat for the next 24 hours.  Get up only to go to the restroom.  You may lie in the bed or on a couch on your back, your stomach, your left side or your right side.  You may have one pillow under your head.  You may have pillows between your knees while you are on your side or under your knees while you are on your back.  2. DO NOT drive today.  Recline the seat as far back as it will go, while still wearing your seat belt, on the way home.  3. You may get up to go to the bathroom as needed.  You may sit up for 10 minutes to eat.  You may resume your normal diet and medications unless otherwise indicated.  Drink lots of extra fluids today and tomorrow.  4. The incidence of headache, nausea, or vomiting is about 5% (one in 20 patients).  If you develop a headache, lie flat and drink plenty of fluids until the headache goes away.  Caffeinated beverages may be helpful.  If you develop severe nausea and vomiting or a headache that does not go away with flat bed rest, call [phone].  5. You may resume normal activities after your 24 hours of bed rest is over; however, do not exert yourself strongly or do any heavy lifting tomorrow. If when you get up you have a headache when standing, go back to bed and force fluids for another 24 hours.  6. Call your physician for a follow-up appointment.  The results of your myelogram will be sent directly to your physician by the following day.  7. If you have any questions or if complications develop after you arrive home, please call [phone].  Discharge instructions have been explained to the patient.  The patient, or the person responsible for the patient, fully understands these instructions.      May resume Wellbutrin on April 12, 2014, after 9:30 am.

## 2014-04-18 ENCOUNTER — Telehealth: Payer: Self-pay

## 2014-04-18 NOTE — Telephone Encounter (Signed)
Patient called requesting his myelogram results.

## 2014-04-18 NOTE — Telephone Encounter (Signed)
Cervical myelogram showed: Facet arthropathy bilaterally at C4-5, C5-6 and C7-T1.  most  severe on the right at C5-6. 1 mm of anterolisthesis at those  levels.   He could benefit from MBB's at these levels.  Let me know if he'd like to proceed.

## 2014-04-19 NOTE — Telephone Encounter (Signed)
Patient informed of myelogram results.  He would like to proceed with the MBB.

## 2014-04-19 NOTE — Telephone Encounter (Signed)
Left message returning patient call/request for his myelogram results.

## 2014-04-21 NOTE — Telephone Encounter (Signed)
Left message for patient advising him of when his MBB is scheduled for.  Asked patient to called back to discuss further.

## 2014-05-03 ENCOUNTER — Encounter: Payer: Self-pay | Admitting: Registered Nurse

## 2014-05-03 ENCOUNTER — Encounter: Payer: BC Managed Care – PPO | Attending: Physical Medicine & Rehabilitation | Admitting: Registered Nurse

## 2014-05-03 VITALS — BP 149/84 | HR 90 | Resp 14 | Ht 71.0 in | Wt 154.0 lb

## 2014-05-03 DIAGNOSIS — M7520 Bicipital tendinitis, unspecified shoulder: Secondary | ICD-10-CM

## 2014-05-03 DIAGNOSIS — M7521 Bicipital tendinitis, right shoulder: Secondary | ICD-10-CM

## 2014-05-03 DIAGNOSIS — M7918 Myalgia, other site: Secondary | ICD-10-CM

## 2014-05-03 DIAGNOSIS — IMO0001 Reserved for inherently not codable concepts without codable children: Secondary | ICD-10-CM

## 2014-05-03 DIAGNOSIS — Z79899 Other long term (current) drug therapy: Secondary | ICD-10-CM

## 2014-05-03 DIAGNOSIS — M722 Plantar fascial fibromatosis: Secondary | ICD-10-CM | POA: Insufficient documentation

## 2014-05-03 DIAGNOSIS — Z5181 Encounter for therapeutic drug level monitoring: Secondary | ICD-10-CM

## 2014-05-03 DIAGNOSIS — M47812 Spondylosis without myelopathy or radiculopathy, cervical region: Secondary | ICD-10-CM | POA: Insufficient documentation

## 2014-05-03 MED ORDER — HYDROCODONE-ACETAMINOPHEN 5-325 MG PO TABS: 1.0000 | ORAL_TABLET | Freq: Three times a day (TID) | ORAL | Status: DC | PRN

## 2014-05-03 MED ORDER — FENTANYL 25 MCG/HR TD PT72: 25.0000 ug | MEDICATED_PATCH | TRANSDERMAL | Status: DC

## 2014-05-03 NOTE — Progress Notes (Signed)
Subjective:    Patient ID: Alexander Douglas, male    DOB: [birth year], 43 y.o.   MRN: [id number]  HPI: Alexander Douglas is a 43 year old male who returns for follow up for chronic pain and medication refill. He says his pain is located in his neck. He rates his pain 7. His current exercise regime is walking and performing stretching exercises daily. He had a CT Myelogram and Results were reviewed: CLINICAL DATA: cervicalgia. Bilateral arm and hand numbness. Pain  and cracking with motion.  FLUOROSCOPY TIME: 1 min 9 seconds  PROCEDURE:  LUMBAR PUNCTURE FOR CERVICAL MYELOGRAM  After thorough discussion of risks and benefits of the procedure  including bleeding, infection, injury to nerves, blood vessels,  adjacent structures as well as headache and CSF leak, written and  oral informed consent was obtained. Consent was obtained by Dr. Nelson Chimes. We discussed the high likelihood of obtaining a diagnostic  study.  Patient was positioned prone on the fluoroscopy table. Local  anesthesia was provided with 1% lidocaine without epinephrine after  prepped and draped in the usual sterile fashion. Puncture was  performed at right L2-3 using a 3 1/2 inch 22-gauge spinal needle  via right approach. Using a single pass through the dura, the needle  was placed within the thecal sac, with return of clear CSF. 10 mL of  Omnipaque-300 was injected into the thecal sac, with normal  opacification of the nerve roots and cauda equina consistent with  free flow within the subarachnoid space. The patient was then moved  to the trendelenburg position and contrast flowed into the Cervical  spine region.  I personally performed the lumbar puncture and administered the  intrathecal contrast. I also personally performed acquisition of the  myelogram images.  TECHNIQUE:  Contiguous axial images were obtained through the Cervical spine  after the intrathecal infusion of infusion. Coronal and sagittal    reconstructions were obtained of the axial image sets.  FINDINGS:  CERVICAL MYELOGRAM FINDINGS:  There is no central canal stenosis. There is no evidence of focal  nerve root compression. There is a small anterior extradural defect  at C6-7.  CT CERVICAL MYELOGRAM FINDINGS:  There is no abnormality at the foramen magnum, C1-2, C2-3 or C3-4.  C4-5: There is bilateral facet arthropathy with 1 mm of  anterolisthesis. Tiny central disc bulge. No central canal stenosis.  No compressive foraminal narrowing.  C5-6: There is bilateral facet arthropathy worse on the right than  the left. 1 mm of anterolisthesis. Minimal bulging of the disc. No  central canal stenosis. No compressive foraminal stenosis.  C6-7: There is spondylosis with bulging of the disc that indents the  ventral subarachnoid space but does not compress the cord. No facet  degeneration at this level. No compressive canal or foraminal  narrowing.  C7-T1: Bilateral facet degeneration with 1 mm of anterolisthesis. No  central canal stenosis. No compressive foraminal stenosis.  IMPRESSION:  Facet arthropathy bilaterally at C4-5, C5-6 and C7-T1. This is most  severe on the right at C5-6. 1 mm of anterolisthesis at those  levels. These findings could certainly relate to the patient's neck  pain and cracking sensations with turning of the neck.  No central canal stenosis. Disc bulge at C6-7 without apparent  neural compression.  No apparent compressive foraminal stenosis.   He has been scheduled for a MBB with Dr. Letta Pate 05/2014 Pain Inventory Average Pain 7 Pain Right Now 7 My pain is sharp, burning, dull,  stabbing, tingling and aching  In the last 24 hours, has pain interfered with the following? General activity 6 Relation with others 9 Enjoyment of life 9 What TIME of day is your pain at its worst? morning, evening Sleep (in general) Fair  Pain is worse with: bending, unsure, some activites and other Pain improves  with: rest, heat/ice, therapy/exercise, medication and TENS Relief from Meds: 6  Mobility walk without assistance ability to climb steps?  yes do you drive?  yes transfers alone  Function employed # of hrs/week na  Neuro/Psych tingling spasms anxiety  Prior Studies Any changes since last visit?  yes CT/MRI myleogram  Physicians involved in your care Any changes since last visit?  no   Family History  Problem Relation Age of Onset  . Diabetes Mother   . Heart disease Mother   . Hyperlipidemia Mother   . Hypertension Mother    History   Social History  . Marital Status: Unknown    Spouse Name: N/A    Number of Children: N/A  . Years of Education: N/A   Social History Main Topics  . Smoking status: Never Smoker   . Smokeless tobacco: Current User  . Alcohol Use: Yes  . Drug Use: No  . Sexual Activity: None   Other Topics Concern  . None   Social History Narrative  . None   Past Surgical History  Procedure Laterality Date  . Foot surgery      Left 8/12 right 10/12  . Wisdom tooth extraction     Past Medical History  Diagnosis Date  . Hyperlipidemia   . Depression    BP 149/84  Pulse 90  Resp 14  Ht 5\' 11"  (1.803 m)  Wt 154 lb (69.854 kg)  BMI 21.49 kg/m2  SpO2 100%  Opioid Risk Score:   Fall Risk Score: Low Fall Risk (0-5 points) (pt educated onf all risk, brochure given to pt previously)   Review of Systems  Constitutional: Positive for diaphoresis.  Musculoskeletal: Positive for back pain.  Neurological:       Spasms, tingling  Psychiatric/Behavioral: The patient is nervous/anxious.   All other systems reviewed and are negative.      Objective:   Physical Exam  Nursing note and vitals reviewed. Constitutional: He is oriented to person, place, and time. He appears well-developed and well-nourished.  HENT:  Head: Normocephalic and atraumatic.  Neck: Normal range of motion. Neck supple.  Decreased ROM Noted  Cardiovascular:  Normal rate and regular rhythm.   Pulmonary/Chest: Effort normal and breath sounds normal.  Musculoskeletal:  Normal Muscle Bulk and Muscle Testing Reveals: Upper Extremities: Full ROM and Muscle Strength 5/5 Back with out spinal or paraspinal tenderness noted Lower Extremities: Full ROM and Muscle Strength 5/5 Arises from chair with ease Narrow based Gait.  Neurological: He is alert and oriented to person, place, and time.  Skin: Skin is warm and dry.  Psychiatric: He has a normal mood and affect.          Assessment & Plan:  1. Cervical Spondylosis: Refilled: Fentanyl Patches one patch every other day. #15 and HYDROcodone 5/325mg  one tablet three times a day as needed #90. Continue with heat and ice therapy, Posture corrector and TENS UNIT.  Will be having MBB in July/2015, pamphlet given. (Anteriolisthesis) 2.Lumbago: No Complaints today.Continue current medication regime, using Voltaren gel and Zanaflex   60 minutes of face to face patient care was spent during this visit. All questions were encouraged and  answered.  F/U in 1 month with Dr. Letta Pate for MBB  F/U in 2 Months with Dr. Naaman Plummer

## 2014-06-07 ENCOUNTER — Ambulatory Visit (HOSPITAL_BASED_OUTPATIENT_CLINIC_OR_DEPARTMENT_OTHER): Payer: BC Managed Care – PPO | Admitting: Physical Medicine & Rehabilitation

## 2014-06-07 ENCOUNTER — Encounter: Payer: BC Managed Care – PPO | Attending: Physical Medicine & Rehabilitation

## 2014-06-07 ENCOUNTER — Encounter: Payer: Self-pay | Admitting: Physical Medicine & Rehabilitation

## 2014-06-07 VITALS — BP 143/91 | HR 78 | Resp 14 | Wt 156.0 lb

## 2014-06-07 DIAGNOSIS — M7520 Bicipital tendinitis, unspecified shoulder: Secondary | ICD-10-CM | POA: Insufficient documentation

## 2014-06-07 DIAGNOSIS — M47812 Spondylosis without myelopathy or radiculopathy, cervical region: Secondary | ICD-10-CM

## 2014-06-07 DIAGNOSIS — M722 Plantar fascial fibromatosis: Secondary | ICD-10-CM

## 2014-06-07 DIAGNOSIS — M7521 Bicipital tendinitis, right shoulder: Secondary | ICD-10-CM

## 2014-06-07 MED ORDER — TIZANIDINE HCL 2 MG PO CAPS: 2.0000 mg | ORAL_CAPSULE | Freq: Three times a day (TID) | ORAL | Status: DC | PRN

## 2014-06-07 MED ORDER — HYDROCODONE-ACETAMINOPHEN 5-325 MG PO TABS: 1.0000 | ORAL_TABLET | Freq: Three times a day (TID) | ORAL | Status: DC | PRN

## 2014-06-07 MED ORDER — FENTANYL 25 MCG/HR TD PT72: 25.0000 ug | MEDICATED_PATCH | TRANSDERMAL | Status: DC

## 2014-06-07 NOTE — Progress Notes (Signed)
Left C4,5,6 Cervical medial branch blocks under fluoroscopic guidance  Indication: Cervical axial pain without radiculitis. Pain is unresponsive to conservative care and interferes with activities of daily living.  Informed consent was obtained after describing risks and benefits of the procedure with the patient this includes bleeding bruising and infection temporary or permanent paralysis. Patient elected to proceed and has given written consent. Patient placed in a lateral decubitus position. Fluoroscopic was suggested to insure adequate view of the articular pillars in a lateral view. Area was marked and prepped with Betadine. 1/2 cc of 1% lidocaine injected into the skin and subcutaneous tissue using a 27-gauge 0.5 inch needle. Then a 25-gauge 1.5 inch needle was inserted under fluoroscopic guidance first targeting the C4 articular pillar center. Bone contact made and confirmed under AP imaging. Then 0.5 mL of 1% MPF lidocaine injected. This same procedure was repeated at C5 and C6 using same technique. Patient tolerated the procedure well. Post procedure instructions given. Adequate postoperative monitoring was performed. See flowsheet  No work Midwife

## 2014-06-07 NOTE — Patient Instructions (Signed)
Cervical medial branch blocks with lidocaine. Please track pain over the next 24-48 hours. If it greater than 50% relief would repeat

## 2014-06-07 NOTE — Progress Notes (Signed)
  PROCEDURE RECORD [facility]   Name: Alexander Douglas DOB:10-06-1971 MRN: [id number]  Date:06/07/2014  Physician: Alysia Penna, MD    Nurse/CMA: Waldo Laine CMA/ Shumaker RN  Allergies: No Known Allergies  Consent Signed: Yes.    Is patient diabetic? No.  CBG today?   Pregnant: No. LMP: No LMP for male patient. (age 42-55)  Anticoagulants: no Anti-inflammatory: no Antibiotics: no  Procedure: Cervical Medial Branch Block Position: Right Lateral Start Time: 2:11 End Time: 2:19 Fluoro Time: 26  RN/CMA Shumaker RN [place] CMA    Time 1336 2:21    BP 143/91 147/94    Pulse 78 74    Respirations 14 14    O2 Sat 100 100    S/S 6 6    Pain Level 6/10 6/10     D/C home with Heather, patient A & O X 3, D/C instructions reviewed, and sits independently.

## 2014-07-08 ENCOUNTER — Encounter: Payer: Self-pay | Admitting: Physical Medicine & Rehabilitation

## 2014-07-08 ENCOUNTER — Encounter
Payer: BC Managed Care – PPO | Attending: Physical Medicine & Rehabilitation | Admitting: Physical Medicine & Rehabilitation

## 2014-07-08 VITALS — BP 167/91 | HR 70 | Resp 14 | Ht 71.0 in | Wt 157.0 lb

## 2014-07-08 DIAGNOSIS — M47812 Spondylosis without myelopathy or radiculopathy, cervical region: Secondary | ICD-10-CM

## 2014-07-08 DIAGNOSIS — M722 Plantar fascial fibromatosis: Secondary | ICD-10-CM | POA: Diagnosis not present

## 2014-07-08 DIAGNOSIS — IMO0001 Reserved for inherently not codable concepts without codable children: Secondary | ICD-10-CM | POA: Diagnosis present

## 2014-07-08 DIAGNOSIS — M7918 Myalgia, other site: Secondary | ICD-10-CM

## 2014-07-08 DIAGNOSIS — M7521 Bicipital tendinitis, right shoulder: Secondary | ICD-10-CM

## 2014-07-08 DIAGNOSIS — M7520 Bicipital tendinitis, unspecified shoulder: Secondary | ICD-10-CM | POA: Diagnosis present

## 2014-07-08 MED ORDER — HYDROCODONE-ACETAMINOPHEN 5-325 MG PO TABS: 1.0000 | ORAL_TABLET | Freq: Three times a day (TID) | ORAL | Status: DC | PRN

## 2014-07-08 MED ORDER — FENTANYL 25 MCG/HR TD PT72: 25.0000 ug | MEDICATED_PATCH | TRANSDERMAL | Status: DC

## 2014-07-08 NOTE — Patient Instructions (Signed)
PLEASE CALL ME WITH ANY PROBLEMS OR QUESTIONS (#[phone]).      

## 2014-07-08 NOTE — Progress Notes (Signed)
Subjective:    Patient ID: Alexander Douglas, male    DOB: [birth year], 43 y.o.   MRN: [id number]  HPI  Alexander Douglas is back regarding his chronic pain issues.  He had 30-40% relief for two weeks after the let cervical MBB's by Dr. Letta Pate. He continues to work full time. He has had to back off his traction due to increased pain while performing.   He has had increased pain in his feet. He has noticed "knots" forming on his arches which are tender to touch and usually not amenable to stretching.  He continues to work full time which requires a lot of standing, overhead work and use of his arms.   His pain medications remain the same.    Pain Inventory Average Pain 6 Pain Right Now 5 My pain is sharp, burning, dull and aching  In the last 24 hours, has pain interfered with the following? General activity 6 Relation with others 7 Enjoyment of life 8 What TIME of day is your pain at its worst? morning, evening Sleep (in general) Poor  Pain is worse with: bending, standing and some activites Pain improves with: rest, heat/ice and medication Relief from Meds: 6  Mobility walk without assistance how many minutes can you walk? alot ability to climb steps?  yes do you drive?  yes transfers alone  Function employed # of hrs/week 52  Neuro/Psych anxiety  Prior Studies Any changes since last visit?  no  Physicians involved in your care Any changes since last visit?  no   Family History  Problem Relation Age of Onset  . Diabetes Mother   . Heart disease Mother   . Hyperlipidemia Mother   . Hypertension Mother    History   Social History  . Marital Status: Unknown    Spouse Name: N/A    Number of Children: N/A  . Years of Education: N/A   Social History Main Topics  . Smoking status: Never Smoker   . Smokeless tobacco: Current User  . Alcohol Use: Yes  . Drug Use: No  . Sexual Activity: None   Other Topics Concern  . None   Social History Narrative  .  None   Past Surgical History  Procedure Laterality Date  . Foot surgery      Left 8/12 right 10/12  . Wisdom tooth extraction     Past Medical History  Diagnosis Date  . Hyperlipidemia   . Depression    BP 167/91  Pulse 70  Resp 14  Ht 5\' 11"  (1.803 m)  Wt 157 lb (71.215 kg)  BMI 21.91 kg/m2  SpO2 100%  Opioid Risk Score:   Fall Risk Score: Low Fall Risk (0-5 points) (pt educated, declied handout)    Review of Systems  Constitutional: Positive for diaphoresis.  Musculoskeletal: Positive for neck pain.  Psychiatric/Behavioral: The patient is nervous/anxious.   All other systems reviewed and are negative.      Objective:   Physical Exam  General: Alert and oriented x 3, No apparent distress  HEENT: Head is normocephalic, atraumatic, PERRLA, EOMI, sclera anicteric, oral mucosa pink and moist, dentition intact, ext ear canals clear,  Neck: Supple without JVD or lymphadenopathy  Heart: Reg rate and rhythm. No murmurs rubs or gallops  Chest: CTA bilaterally without wheezes, rales, or rhonchi; no distress  Abdomen: Soft, non-tender, non-distended, bowel sounds positive.  Extremities: No clubbing, cyanosis, or edema. Pulses are 2+  Skin: Clean and intact without signs of breakdown  Neuro:  Pt is cognitively appropriate with normal insight, memory, and awareness. Cranial nerves 2-12 are intact. Sensory exam is normal. Reflexes are 2+ in all 4's. Fine motor coordination is intact. No tremors. Motor function is grossly 5/5.  Musculoskeletal: Posture  still in a head forward position. Traps are tight. He has crepitus in the neck with rotation, flexion, and extension. Minimal pain with flexion today. More pain with extension.  Facet maneuvers positive. Both medial arches remain tender with scarring noted along the flexor tendons and arch. Both were somewhat tender to touch, left more than right. Gait mildly antalgic  Psych: Pt's affect is appropriate. Pt is cooperative   Assessment  & Plan:   1. Chronic plantar fasciitis with plantar fibroids  2. Right biceps tendonitis, labral injury  3. Cervical spondylosis, ?facet arthopathy  4. Shift sleep disorder   Plan:  1. Continue voltaren gel to both feet, right shoulder  2. Will ask Dr. Letta Pate to perform bilateral MBB's for C4-5 and C5-6. He had 35-40%  Relief with the left sided blocks last month. I don't believe his C6-7 disk is clinically significant.  3. Continue fentanyl to 2mcg q48 hours and hydrocodone. rxes were refilled today. 4. Workload is not helping things.  5. Continue with zanaflex before sleep.    6. Naproxen 2 tabs BID with food.  7. Follow up with Dr. Letta Pate in about a month for procedure.  30 minutes of face to face patient care time were spent during this visit. All questions were encouraged and answered.

## 2014-07-19 ENCOUNTER — Telehealth: Payer: Self-pay | Admitting: *Deleted

## 2014-07-19 NOTE — Telephone Encounter (Signed)
Left message for Alexander Douglas to call office and let us know if he wants his form mailed or to pick up

## 2014-08-09 ENCOUNTER — Encounter: Payer: BC Managed Care – PPO | Attending: Physical Medicine & Rehabilitation

## 2014-08-09 ENCOUNTER — Ambulatory Visit (HOSPITAL_BASED_OUTPATIENT_CLINIC_OR_DEPARTMENT_OTHER): Payer: BC Managed Care – PPO | Admitting: Physical Medicine & Rehabilitation

## 2014-08-09 VITALS — BP 116/78 | HR 48 | Resp 14 | Ht 71.0 in | Wt 151.0 lb

## 2014-08-09 DIAGNOSIS — M7520 Bicipital tendinitis, unspecified shoulder: Secondary | ICD-10-CM | POA: Diagnosis present

## 2014-08-09 DIAGNOSIS — M722 Plantar fascial fibromatosis: Secondary | ICD-10-CM | POA: Insufficient documentation

## 2014-08-09 DIAGNOSIS — IMO0001 Reserved for inherently not codable concepts without codable children: Secondary | ICD-10-CM | POA: Insufficient documentation

## 2014-08-09 DIAGNOSIS — M47812 Spondylosis without myelopathy or radiculopathy, cervical region: Secondary | ICD-10-CM

## 2014-08-09 DIAGNOSIS — M7918 Myalgia, other site: Secondary | ICD-10-CM

## 2014-08-09 DIAGNOSIS — M7521 Bicipital tendinitis, right shoulder: Secondary | ICD-10-CM

## 2014-08-09 MED ORDER — HYDROCODONE-ACETAMINOPHEN 5-325 MG PO TABS: 1.0000 | ORAL_TABLET | Freq: Three times a day (TID) | ORAL | Status: DC | PRN

## 2014-08-09 MED ORDER — FENTANYL 25 MCG/HR TD PT72: 25.0000 ug | MEDICATED_PATCH | TRANSDERMAL | Status: DC

## 2014-08-09 NOTE — Progress Notes (Signed)
Bilateral C4,5,6 Cervical medial branch blocks under fluoroscopic guidance  Indication: Cervical axial pain without radiculitis. Pain is unresponsive to conservative care and interferes with activities of daily living.  Informed consent was obtained after describing risks and benefits of the procedure with the patient this includes bleeding bruising and infection temporary or permanent paralysis. Patient elected to proceed and has given written consent. Patient placed in a lateral decubitus position. Fluoroscopic was suggested to insure adequate view of the articular pillars in a lateral view. Area was marked and prepped with Betadine. 1/2 cc of 1% lidocaine injected into the skin and subcutaneous tissue using a 27-gauge 0.5 inch needle. Then a 25-gauge 1.5 inch needle was inserted under fluoroscopic guidance first targeting the C4 articular pillar center. Bone contact made and confirmed under AP imaging. Then 0.5 mL of 1% MPF lidocaine injected. This same procedure was repeated at C5 and C6 using same technique.Then the pt was positioned on the opposite side and the right C4,C5,C6, articular pillars were targeted with the same technique and equipment and injectate. Patient tolerated the procedure well. Post procedure instructions given. Adequate postoperative monitoring was performed. See flowsheet  No work Midwife

## 2014-08-09 NOTE — Patient Instructions (Addendum)
Please keep track of percent pain relief so you can discuss with Dr Naaman Plummer at next visit   If pain relief is greater than 50% at least on a short-term basis then radiofrequency may be a good treatment option

## 2014-08-09 NOTE — Progress Notes (Signed)
  PROCEDURE RECORD [facility]   Name: Alexander Douglas DOB:Feb 08, 1971 MRN: [id number]  Date:08/09/2014  Physician: Alysia Penna, MD    Nurse/CMA: Claiborne Rigg RN/Ginkle CMA  Allergies: No Known Allergies  Consent Signed: Yes.    Is patient diabetic? No.  CBG today?   Pregnant: No. LMP: No LMP for male patient. (age 42-55)  Anticoagulants: no Anti-inflammatory: yes (aleve) Antibiotics: no  Procedure: MBBB C4, 5  Position: Prone Start Time: 3:54 End Time: 4:14 Fluoro Time: 40  RN/CMA Ginkle CMA Shumaker RN    Time 3:17 4:18    BP 117/78 134/78    Pulse 48 60    Respirations 14 14    O2 Sat 90 99    S/S 6 6    Pain Level 5/10 3/10     D/C home with Heather, patient A & O X 3, D/C instructions reviewed, and sits independently.

## 2014-08-12 ENCOUNTER — Encounter: Payer: Self-pay | Admitting: Physical Medicine & Rehabilitation

## 2014-08-22 ENCOUNTER — Other Ambulatory Visit (HOSPITAL_COMMUNITY): Payer: Self-pay | Admitting: Family Medicine

## 2014-08-22 ENCOUNTER — Ambulatory Visit (HOSPITAL_COMMUNITY)
Admission: RE | Admit: 2014-08-22 | Discharge: 2014-08-22 | Disposition: A | Discharge status: Home / Self Care | Payer: BC Managed Care – PPO | Source: Ambulatory Visit | Attending: Family Medicine | Admitting: Family Medicine

## 2014-08-22 DIAGNOSIS — R05 Cough: Secondary | ICD-10-CM | POA: Diagnosis not present

## 2014-08-22 DIAGNOSIS — R059 Cough, unspecified: Secondary | ICD-10-CM

## 2014-09-06 ENCOUNTER — Encounter: Payer: Self-pay | Admitting: Registered Nurse

## 2014-09-06 ENCOUNTER — Encounter: Payer: BC Managed Care – PPO | Attending: Physical Medicine & Rehabilitation | Admitting: Registered Nurse

## 2014-09-06 VITALS — BP 134/78 | HR 76 | Resp 14 | Ht 71.0 in | Wt 155.0 lb

## 2014-09-06 DIAGNOSIS — M797 Fibromyalgia: Secondary | ICD-10-CM | POA: Insufficient documentation

## 2014-09-06 DIAGNOSIS — M5010 Cervical disc disorder with radiculopathy, unspecified cervical region: Secondary | ICD-10-CM

## 2014-09-06 DIAGNOSIS — M47812 Spondylosis without myelopathy or radiculopathy, cervical region: Secondary | ICD-10-CM | POA: Diagnosis not present

## 2014-09-06 DIAGNOSIS — Z5181 Encounter for therapeutic drug level monitoring: Secondary | ICD-10-CM | POA: Insufficient documentation

## 2014-09-06 DIAGNOSIS — M7521 Bicipital tendinitis, right shoulder: Secondary | ICD-10-CM | POA: Insufficient documentation

## 2014-09-06 DIAGNOSIS — G894 Chronic pain syndrome: Secondary | ICD-10-CM | POA: Diagnosis present

## 2014-09-06 DIAGNOSIS — M654 Radial styloid tenosynovitis [de Quervain]: Secondary | ICD-10-CM

## 2014-09-06 DIAGNOSIS — M7918 Myalgia, other site: Secondary | ICD-10-CM

## 2014-09-06 DIAGNOSIS — Z79899 Other long term (current) drug therapy: Secondary | ICD-10-CM | POA: Insufficient documentation

## 2014-09-06 DIAGNOSIS — M722 Plantar fascial fibromatosis: Secondary | ICD-10-CM | POA: Diagnosis present

## 2014-09-06 MED ORDER — DICLOFENAC SODIUM 1 % TD GEL: 2.0000 g | Freq: Four times a day (QID) | TRANSDERMAL | Status: DC

## 2014-09-06 MED ORDER — METHYLPREDNISOLONE 4 MG PO KIT: PACK | ORAL | Status: DC

## 2014-09-06 MED ORDER — HYDROCODONE-ACETAMINOPHEN 5-325 MG PO TABS: 1.0000 | ORAL_TABLET | Freq: Three times a day (TID) | ORAL | Status: DC | PRN

## 2014-09-06 MED ORDER — FENTANYL 25 MCG/HR TD PT72: 25.0000 ug | MEDICATED_PATCH | TRANSDERMAL | Status: DC

## 2014-09-06 NOTE — Progress Notes (Signed)
Subjective:    Patient ID: Alexander Douglas, male    DOB: Jul 15, 1971, 43 y.o.   MRN: [id number]  HPI: Mr. Freemon Binford is a 43 year old male who returns for follow up for chronic pain and medication refill. He says his pain is located in his neck radiating into upper extremities, bilateral shoulder's, and left hand. He rates his pain 6. His current exercise regime is walking and performing stretching exercises daily.  Pain Inventory Average Pain 6 Pain Right Now 6 My pain is intermittent, sharp and aching  In the last 24 hours, has pain interfered with the following? General activity 8 Relation with others 8 Enjoyment of life 8 What TIME of day is your pain at its worst? morning, night Sleep (in general) Poor  Pain is worse with: some activites Pain improves with: rest, medication, TENS and injections Relief from Meds: 6  Mobility walk without assistance ability to climb steps?  yes do you drive?  yes Do you have any goals in this area?  no  Function employed # of hrs/week 52 Do you have any goals in this area?  no  Neuro/Psych tingling anxiety  Prior Studies Any changes since last visit?  no  Physicians involved in your care Any changes since last visit?  no   Family History  Problem Relation Age of Onset  . Diabetes Mother   . Heart disease Mother   . Hyperlipidemia Mother   . Hypertension Mother    History   Social History  . Marital Status: Single    Spouse Name: N/A    Number of Children: N/A  . Years of Education: N/A   Social History Main Topics  . Smoking status: Never Smoker   . Smokeless tobacco: Current User  . Alcohol Use: Yes  . Drug Use: No  . Sexual Activity: None   Other Topics Concern  . None   Social History Narrative  . None   Past Surgical History  Procedure Laterality Date  . Foot surgery      Left 8/12 right 10/12  . Wisdom tooth extraction     Past Medical History  Diagnosis Date  . Hyperlipidemia   .  Depression    BP 134/78  Pulse 76  Resp 14  Ht 5\' 11"  (1.803 m)  Wt 155 lb (70.308 kg)  BMI 21.63 kg/m2  SpO2 100%  Opioid Risk Score:   Fall Risk Score: Low Fall Risk (0-5 points)  Review of Systems     Objective:   Physical Exam  Nursing note and vitals reviewed. Constitutional: He is oriented to person, place, and time. He appears well-developed and well-nourished.  HENT:  Head: Normocephalic and atraumatic.  Neck: Normal range of motion. Neck supple.  Cervical Paraspinal Tenderness: C-3- C-5  Cardiovascular: Normal rate and regular rhythm.   Pulmonary/Chest: Effort normal and breath sounds normal.  Musculoskeletal:  Normal Muscle Bulk and Muscle testing Reveals: Upper Extremities: Full ROM and Muscle Strength 5/5 Left Thumb+ Finkelstein Lower Extremities: Full ROM and Muscle Strength 5/5 Arises from chair with ease Narrow Based gait.   Neurological: He is alert and oriented to person, place, and time.  Skin: Skin is warm and dry.  Psychiatric: He has a normal mood and affect.          Assessment & Plan:  1. Cervical Spondylosis: Refilled: Fentanyl Patches one patch every other day. #15 and HYDROcodone 5/325mg  one tablet three times a day as needed #90. Continue with heat and  ice therapy, Posture corrector and TENS UNIT.  S/P MBB, relief noted for 2 weeks. 2. Cervical Radiculopathy: F/U with Dr. Merlene Laughter 3. DeQuervains Tenosynovitis: Medrol Dose pak 4.Lumbago: No Complaints today.Continue current medication regime, using Voltaren gel and Zanaflex   20 minutes of face to face patient care was spent during this visit. All questions were encouraged and answered.   F/U in 1 month

## 2014-09-07 ENCOUNTER — Other Ambulatory Visit: Payer: Self-pay | Admitting: Physical Medicine & Rehabilitation

## 2014-09-12 ENCOUNTER — Encounter: Payer: Self-pay | Admitting: Neurology

## 2014-09-12 ENCOUNTER — Ambulatory Visit (INDEPENDENT_AMBULATORY_CARE_PROVIDER_SITE_OTHER): Payer: BC Managed Care – PPO | Admitting: Neurology

## 2014-09-12 VITALS — BP 124/77 | HR 79 | Temp 98.9°F | Ht 70.0 in | Wt 161.0 lb

## 2014-09-12 DIAGNOSIS — F39 Unspecified mood [affective] disorder: Secondary | ICD-10-CM

## 2014-09-12 DIAGNOSIS — G8929 Other chronic pain: Secondary | ICD-10-CM

## 2014-09-12 DIAGNOSIS — G4726 Circadian rhythm sleep disorder, shift work type: Secondary | ICD-10-CM
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
## 2014-09-12 NOTE — Patient Instructions (Signed)
I will make my suggestions to your doctors.  I think your sleepiness is due to multiple issues, including shift work, suboptimally treated mood disorder, sedating medications, including Xanax and narcotic pain medication.  I think you may benefit from seeing a psychiatrist and a therapist, discuss with Dr. Ethlyn Gallery.

## 2014-10-03 ENCOUNTER — Encounter: Payer: BC Managed Care – PPO | Admitting: Physical Medicine & Rehabilitation

## 2014-10-04 ENCOUNTER — Encounter: Payer: Self-pay | Admitting: Physical Medicine & Rehabilitation

## 2014-10-04 ENCOUNTER — Encounter
Payer: BC Managed Care – PPO | Attending: Physical Medicine & Rehabilitation | Admitting: Physical Medicine & Rehabilitation

## 2014-10-04 VITALS — BP 143/83 | HR 70 | Resp 14 | Ht 71.0 in | Wt 157.0 lb

## 2014-10-04 DIAGNOSIS — Z79899 Other long term (current) drug therapy: Secondary | ICD-10-CM | POA: Diagnosis present

## 2014-10-04 DIAGNOSIS — M7521 Bicipital tendinitis, right shoulder: Secondary | ICD-10-CM | POA: Diagnosis not present

## 2014-10-04 DIAGNOSIS — M722 Plantar fascial fibromatosis: Secondary | ICD-10-CM | POA: Diagnosis not present

## 2014-10-04 DIAGNOSIS — G894 Chronic pain syndrome: Secondary | ICD-10-CM | POA: Diagnosis not present

## 2014-10-04 DIAGNOSIS — M654 Radial styloid tenosynovitis [de Quervain]: Secondary | ICD-10-CM | POA: Diagnosis not present

## 2014-10-04 DIAGNOSIS — M797 Fibromyalgia: Secondary | ICD-10-CM | POA: Insufficient documentation

## 2014-10-04 DIAGNOSIS — M7918 Myalgia, other site: Secondary | ICD-10-CM

## 2014-10-04 DIAGNOSIS — Z5181 Encounter for therapeutic drug level monitoring: Secondary | ICD-10-CM | POA: Insufficient documentation

## 2014-10-04 DIAGNOSIS — M47812 Spondylosis without myelopathy or radiculopathy, cervical region: Secondary | ICD-10-CM | POA: Insufficient documentation

## 2014-10-04 MED ORDER — HYDROCODONE-ACETAMINOPHEN 5-325 MG PO TABS: 1.0000 | ORAL_TABLET | Freq: Three times a day (TID) | ORAL | Status: DC | PRN

## 2014-10-04 MED ORDER — FENTANYL 25 MCG/HR TD PT72: 25.0000 ug | MEDICATED_PATCH | TRANSDERMAL | Status: DC

## 2014-10-04 NOTE — Progress Notes (Signed)
Subjective:    Patient ID: Alexander Douglas, male    DOB: [birth year], 43 y.o.   MRN: [id number]  HPI   Alexander Douglas is back regarding his chronic pain.   He has having new pain in his left thumb. He has had pain when he has open jars or pick up heavy objects. For pain relief he has used voltaren gel which helps a bit. He also received a medrol pack.   The cervical MBB's provided 50% relief of his neck pain. He had relief for about 2weeks in total and then pain gradually recurred.   He continues to work full time but his hours are a little less. He's only working 45-48 hrs per week. He tries to pace himself when he can.   He continues on the fentanyl patch and hydrocodone for pain relief.    Pain Inventory Average Pain 6 Pain Right Now 6 My pain is sharp, dull, stabbing and tingling  In the last 24 hours, has pain interfered with the following? General activity 5 Relation with others 7 Enjoyment of life 8 What TIME of day is your pain at its worst? morning, night Sleep (in general) Poor  Pain is worse with: standing and some activites Pain improves with: rest, heat/ice, therapy/exercise, medication and injections Relief from Meds: 6  Mobility walk without assistance how many minutes can you walk? walk with pain for awhile ability to climb steps?  yes do you drive?  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  no  Physicians involved in your care Any changes since last visit?  no   Family History  Problem Relation Age of Onset  . Diabetes Mother   . Heart disease Mother   . Hyperlipidemia Mother   . Hypertension Mother    History   Social History  . Marital Status: Single    Spouse Name: N/A    Number of Children: 0  . Years of Education: 14   Occupational History  .      [employer]   Social History Main  Topics  . Smoking status: Never Smoker   . Smokeless tobacco: Current User    Types: Chew     Comment: tobacco  . Alcohol Use: No  . Drug Use: No  . Sexual Activity: None   Other Topics Concern  . None   Social History Narrative   Patient consumes 4 cups of caffeine and is right handed   Past Surgical History  Procedure Laterality Date  . Foot surgery      Left 8/12 right 10/12  . Wisdom tooth extraction    . Neck surgery     Past Medical History  Diagnosis Date  . Hyperlipidemia   . Depression   . Plantar fasciitis   . Migraine   . Anxiety   . Cervical disc disorder    BP 143/83 mmHg  Pulse 70  Resp 14  Ht 5\' 11"  (1.803 m)  Wt 157 lb (71.215 kg)  BMI 21.91 kg/m2  SpO2 100%  Opioid Risk Score:   Fall Risk Score: Low Fall Risk (0-5 points) Review of Systems  Constitutional: Positive for unexpected weight change.       Weight loss Night sweats   Neurological: Positive for numbness.       Tingling Spasms   Psychiatric/Behavioral: Positive for confusion. The patient is nervous/anxious.  All other systems reviewed and are negative.      Objective:   Physical Exam  General: Alert and oriented x 3, No apparent distress  HEENT: Head is normocephalic, atraumatic, PERRLA, EOMI, sclera anicteric, oral mucosa pink and moist, dentition intact, ext ear canals clear,  Neck: Supple without JVD or lymphadenopathy  Heart: Reg rate and rhythm. No murmurs rubs or gallops  Chest: CTA bilaterally without wheezes, rales, or rhonchi; no distress  Abdomen: Soft, non-tender, non-distended, bowel sounds positive.  Extremities: No clubbing, cyanosis, or edema. Pulses are 2+  Skin: Clean and intact without signs of breakdown  Neuro: Pt is cognitively appropriate with normal insight, memory, and awareness. Cranial nerves 2-12 are intact. Sensory exam is normal. Reflexes are 2+ in all 4's. Fine motor coordination is intact. No tremors. Motor function is grossly 5/5.    Musculoskeletal: Posture still in a head forward position. Traps are tight. He has crepitus in the neck with rotation, flexion, and extension. Minimal pain with flexion today. More pain with extension. Facet maneuvers positive. Both medial arches are tender with scarring noted along the flexor tendons and arch. Gait slightly antalgic. He has pain with palpation along the APL tendon. finkelstein's test positive left hand. Psych: Pt's affect is appropriate. Pt is cooperative     Assessment & Plan:   1. Chronic plantar fasciitis with plantar fibroids  2. Right biceps tendonitis, labral injury  3. Cervical spondylosis, ?facet arthopathy  4. Shift sleep disorder  5. De Quervain's Tenosynovitis: Left hand  Plan:  1. Continue voltaren gel to both feet, right shoulder  2. Will refer to  Dr. Letta Pate for bilateral RF's C4-5 and C5-6. He had 50% Relief with the bilateral blocks last month.  3. Continue fentanyl to 74mcg q48 hours and hydrocodone. rxes were refilled today.  4.After informed consent and preparation of the skin with betadine and isopropyl alcohol, I injected 6mg  (1cc) of celestone and 4cc of 1% lidocaine around the left APL  via anterior approach. Additionally, aspiration was performed prior to injection. The patient tolerated well, and no complications were encountered. Afterward the area was cleaned and dressed. Post- injection instructions were provided.    5. Continue with zanaflex before sleep.   6. Naproxen 2 tabs BID with food.  7. Follow up with Dr. Letta Pate in about a month for procedure. 30 minutes of face to face patient care time were spent during this visit. All questions were encouraged and answered.

## 2014-10-04 NOTE — Patient Instructions (Signed)
Try an elbow pad for your right elbow at night. Keep arm extended if possible.  Use a splint for your left hand at night as well as ice for flares in your wrist pain

## 2014-10-10 ENCOUNTER — Telehealth: Payer: Self-pay | Admitting: Physical Medicine & Rehabilitation

## 2014-10-10 DIAGNOSIS — M722 Plantar fascial fibromatosis: Secondary | ICD-10-CM

## 2014-10-10 DIAGNOSIS — M7521 Bicipital tendinitis, right shoulder: Secondary | ICD-10-CM

## 2014-10-10 DIAGNOSIS — M47812 Spondylosis without myelopathy or radiculopathy, cervical region: Secondary | ICD-10-CM

## 2014-10-10 NOTE — Telephone Encounter (Signed)
Pharmacy faxed a refill request in on 10/05/14 for Zanaflex and was given 60 tabs and normally gets 90 tabs.  Wanted to know if it was changed by Dr. Naaman Plummer or was it a mistake.  Please call.

## 2014-10-11 MED ORDER — TIZANIDINE HCL 2 MG PO CAPS: 2.0000 mg | ORAL_CAPSULE | Freq: Three times a day (TID) | ORAL | Status: DC | PRN

## 2014-10-11 NOTE — Telephone Encounter (Signed)
I called Alexander Douglas and told him we had not refilled that medication since 05/2014.  I expect that he called for a refill from his pharmacy from an older rx that had a refill on it (hx of being prescribed #60) and they filled it off of that rx number called to the pharmacy.  I told him we could refill as Dr Naaman Plummer had prescribed it with #90 (1 tid prn).  Order sent to the pharmacy.

## 2014-11-02 ENCOUNTER — Encounter: Payer: Self-pay | Admitting: Registered Nurse

## 2014-11-02 ENCOUNTER — Encounter: Payer: BC Managed Care – PPO | Attending: Physical Medicine & Rehabilitation | Admitting: Registered Nurse

## 2014-11-02 VITALS — BP 141/97 | HR 79 | Resp 14

## 2014-11-02 DIAGNOSIS — Z79899 Other long term (current) drug therapy: Secondary | ICD-10-CM | POA: Diagnosis present

## 2014-11-02 DIAGNOSIS — M654 Radial styloid tenosynovitis [de Quervain]: Secondary | ICD-10-CM | POA: Diagnosis not present

## 2014-11-02 DIAGNOSIS — Z5181 Encounter for therapeutic drug level monitoring: Secondary | ICD-10-CM

## 2014-11-02 DIAGNOSIS — M47812 Spondylosis without myelopathy or radiculopathy, cervical region: Secondary | ICD-10-CM | POA: Diagnosis not present

## 2014-11-02 DIAGNOSIS — M7521 Bicipital tendinitis, right shoulder: Secondary | ICD-10-CM | POA: Insufficient documentation

## 2014-11-02 DIAGNOSIS — G894 Chronic pain syndrome: Secondary | ICD-10-CM | POA: Insufficient documentation

## 2014-11-02 DIAGNOSIS — M722 Plantar fascial fibromatosis: Secondary | ICD-10-CM | POA: Diagnosis present

## 2014-11-02 DIAGNOSIS — M797 Fibromyalgia: Secondary | ICD-10-CM | POA: Insufficient documentation

## 2014-11-02 DIAGNOSIS — M7918 Myalgia, other site: Secondary | ICD-10-CM

## 2014-11-02 MED ORDER — GABAPENTIN 100 MG PO CAPS: 100.0000 mg | ORAL_CAPSULE | Freq: Three times a day (TID) | ORAL | Status: DC

## 2014-11-02 MED ORDER — HYDROCODONE-ACETAMINOPHEN 5-325 MG PO TABS: 1.0000 | ORAL_TABLET | Freq: Three times a day (TID) | ORAL | Status: DC | PRN

## 2014-11-02 MED ORDER — FENTANYL 25 MCG/HR TD PT72: 25.0000 ug | MEDICATED_PATCH | TRANSDERMAL | Status: DC

## 2014-11-02 NOTE — Patient Instructions (Signed)
Start Gabapentin 100 mg capsule once a day for a week  If tingling persists  Take gabapentin 100 mg capsule twice a day for a week  If tingling persists   Take Gabapentin 100 mg capsules three times a day

## 2014-11-02 NOTE — Progress Notes (Signed)
Subjective:    Patient ID: Alexander Douglas, male    DOB: 16-Nov-1970, 43 y.o.   MRN: [id number]  HPI: Mr. Price Lachapelle is a 43 year old male who returns for follow up for chronic pain and medication refill. He says his pain is located in his neck radiating into upper extremities, bilateral shoulder's, and left thumb. He rates his pain 7. His current exercise regime is walking.  According to Dr. Naaman Plummer note will schedule for bilateral Radio frequency C4-C-5 and C-5- C6 with Dr. Read Drivers, he verbalizes understanding.  Pain Inventory Average Pain 6 Pain Right Now 7 My pain is intermittent, constant, sharp, dull, tingling and aching  In the last 24 hours, has pain interfered with the following? General activity 7 Relation with others 9 Enjoyment of life 8 What TIME of day is your pain at its worst? morning, night Sleep (in general) Fair  Pain is worse with: walking, bending and standing Pain improves with: heat/ice, medication, TENS and injections Relief from Meds: 6  Mobility how many minutes can you walk? alot with pain ability to climb steps?  yes do you drive?  yes Do you have any goals in this area?  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  no  Neuro/Psych anxiety  Prior Studies Any changes since last visit?  no  Physicians involved in your care Any changes since last visit?  no   Family History  Problem Relation Age of Onset  . Diabetes Mother   . Heart disease Mother   . Hyperlipidemia Mother   . Hypertension Mother    History   Social History  . Marital Status: Single    Spouse Name: N/A    Number of Children: 0  . Years of Education: 14   Occupational History  .      [employer]   Social History Main Topics  . Smoking status: Never Smoker   . Smokeless tobacco: Current User    Types: Chew     Comment: tobacco  . Alcohol Use: No  . Drug Use: No  . Sexual Activity: None    Other Topics Concern  . None   Social History Narrative   Patient consumes 4 cups of caffeine and is right handed   Past Surgical History  Procedure Laterality Date  . Foot surgery      Left 8/12 right 10/12  . Wisdom tooth extraction    . Neck surgery     Past Medical History  Diagnosis Date  . Hyperlipidemia   . Depression   . Plantar fasciitis   . Migraine   . Anxiety   . Cervical disc disorder    BP 141/97 mmHg  Pulse 79  Resp 14  SpO2 100%  Opioid Risk Score:   Fall Risk Score: Low Fall Risk (0-5 points) (pt declined pampohlet)  Review of Systems  Psychiatric/Behavioral: The patient is nervous/anxious.   All other systems reviewed and are negative.      Objective:   Physical Exam  Constitutional: He is oriented to person, place, and time. He appears well-developed and well-nourished.  HENT:  Head: Normocephalic and atraumatic.  Neck: Normal range of motion. Neck supple.  Cervical Paraspinal Tenderness: C-4- C-6  Cardiovascular: Normal rate and regular rhythm.   Pulmonary/Chest: Effort normal and breath sounds normal.  Musculoskeletal:  Normal Muscle Bulk and Muscle Testing Reveals: Upper Extremities: Full ROM and Muscle Strength 5/5  Bilateral AC Joint Tenderness Bilateral Spine of scapula Tenderness Lower Extremities: Full ROM and Muscle strength 5/5 Arises from chair with ease Narrow based gait    Neurological: He is alert and oriented to person, place, and time.  Skin: Skin is warm and dry.  Psychiatric: He has a normal mood and affect.  Nursing note and vitals reviewed.         Assessment & Plan:  1. Cervical Spondylosis: Refilled: Fentanyl Patches one patch every other day. #15 and HYDROcodone 5/325mg  one tablet three times a day as needed #90. Continue with heat and ice therapy. Bilateral Radiofrequency scheduled with Dr. Letta Pate 2. Cervical Radiculopathy: RX: Gabapentin. Instructions given will strt on Friday 11/04/14 when he's off   3. DeQuervains Tenosynovitis: S/P cortisone injection.  Continue to monitor. 4.Lumbago: No Complaints today.Continue current medication regime, using Voltaren gel and Zanaflex   20 minutes of face to face patient care was spent during this visit. All questions were encouraged and answered.   F/U in 1 month

## 2014-11-07 ENCOUNTER — Telehealth: Payer: Self-pay | Admitting: *Deleted

## 2014-11-07 NOTE — Telephone Encounter (Signed)
Alexander Douglas was scheduled for bilateral cervical RF C4-5-6  12/01/14. We do not do Cervical RF's.   Per Dr Letta Pate, "We can repeat bilateral Cervical MBB, I don't do RF. Please let Alexander Douglas know next week so he can make referral. [facility] Dr Alexander Douglas is now part of [facility] FYI "  ( I have changed the appt note to reflect this is not for RF but MBB)

## 2014-11-07 NOTE — Telephone Encounter (Signed)
i thought he did apparently...............  Can refer to Dr. Ernestina Patches if he's doing these

## 2014-11-15 NOTE — Telephone Encounter (Signed)
Please do referral to Dr. Ernestina Patches for this patient - thank you

## 2014-12-01 ENCOUNTER — Ambulatory Visit: Payer: BLUE CROSS/BLUE SHIELD | Admitting: Physical Medicine & Rehabilitation

## 2014-12-05 ENCOUNTER — Encounter: Payer: BLUE CROSS/BLUE SHIELD | Attending: Physical Medicine & Rehabilitation

## 2014-12-05 ENCOUNTER — Ambulatory Visit: Payer: BLUE CROSS/BLUE SHIELD | Admitting: Physical Medicine & Rehabilitation

## 2014-12-05 ENCOUNTER — Telehealth: Payer: Self-pay | Admitting: *Deleted

## 2014-12-05 ENCOUNTER — Other Ambulatory Visit: Payer: Self-pay | Admitting: *Deleted

## 2014-12-05 DIAGNOSIS — M722 Plantar fascial fibromatosis: Secondary | ICD-10-CM | POA: Insufficient documentation

## 2014-12-05 DIAGNOSIS — M7521 Bicipital tendinitis, right shoulder: Secondary | ICD-10-CM

## 2014-12-05 DIAGNOSIS — M7918 Myalgia, other site: Secondary | ICD-10-CM

## 2014-12-05 DIAGNOSIS — Z79899 Other long term (current) drug therapy: Secondary | ICD-10-CM | POA: Insufficient documentation

## 2014-12-05 DIAGNOSIS — M47812 Spondylosis without myelopathy or radiculopathy, cervical region: Secondary | ICD-10-CM

## 2014-12-05 DIAGNOSIS — M797 Fibromyalgia: Secondary | ICD-10-CM | POA: Insufficient documentation

## 2014-12-05 DIAGNOSIS — G894 Chronic pain syndrome: Secondary | ICD-10-CM | POA: Insufficient documentation

## 2014-12-05 DIAGNOSIS — Z5181 Encounter for therapeutic drug level monitoring: Secondary | ICD-10-CM | POA: Insufficient documentation

## 2014-12-05 MED ORDER — HYDROCODONE-ACETAMINOPHEN 5-325 MG PO TABS: 1.0000 | ORAL_TABLET | Freq: Three times a day (TID) | ORAL | Status: DC | PRN

## 2014-12-05 MED ORDER — FENTANYL 25 MCG/HR TD PT72: 25.0000 ug | MEDICATED_PATCH | TRANSDERMAL | Status: DC

## 2014-12-05 NOTE — Telephone Encounter (Signed)
Referral being place to Dr Ernestina Patches at [facility] for Cervical RF.

## 2014-12-05 NOTE — Telephone Encounter (Signed)
Alexander Douglas came in for his RF procedure this morning which we do not do. We are not seeing him for a visit today and are refunding his copay.  I have given him a refill on his Fentanyl and hydrocodone signed by Dr Naaman Plummer and Anner Crete practice manager talked with him about his referral out for this procedure

## 2015-01-02 ENCOUNTER — Telehealth: Payer: Self-pay | Admitting: *Deleted

## 2015-01-02 NOTE — Telephone Encounter (Signed)
Pt was referred to Dr. Inda Merlin for cervical RF, instead of getting procedure, Dr. Inda Merlin wants him to get an MRI. Pt says the problem is he put on his last patch @ 1 AM 01/02/2015, but has enough hydrocodone to get him to the 2nd of March, wondering what he should do   (???????), we need to call him back to clarify before this message goes to Dr. Naaman Plummer. He does not have a future appt scheduled at the moment.

## 2015-01-03 NOTE — Telephone Encounter (Signed)
Mr Honeyman was referred to [facility] for his RF.  He came to his appt with Kirst[facility]ins in January thinking we were doing the RF and Dr Letta Pate does not do them, so he was given his rxs without a visit.  I think the question is, is he transferring his care completely or just for the procedure.  He is due for his refills. (See previous message where Inda Merlin ordered an MRI instead of doing procedure...)

## 2015-01-04 ENCOUNTER — Other Ambulatory Visit: Payer: Self-pay | Admitting: *Deleted

## 2015-01-04 NOTE — Telephone Encounter (Signed)
Patient called and left message that he has had the MRI done and he is asking about his pain medication. We are waiting for Dr. Naaman Plummer answer. This is the 3rd call from this patient

## 2015-01-04 NOTE — Telephone Encounter (Signed)
Called patient and left a detailed message for him to call back [phone] hit 0 for the operator and schedule an appt with Zella Ball  for Thursday, Friday and Monday, so he can get his RX's.

## 2015-01-05 NOTE — Telephone Encounter (Signed)
Patient has called again about an appointment for his medication refill.  I have put patient on Alexander Douglas's schedule for tomorrow 01/06/14.

## 2015-01-06 ENCOUNTER — Other Ambulatory Visit: Payer: Self-pay | Admitting: Registered Nurse

## 2015-01-06 ENCOUNTER — Encounter: Payer: BLUE CROSS/BLUE SHIELD | Attending: Physical Medicine & Rehabilitation | Admitting: Registered Nurse

## 2015-01-06 ENCOUNTER — Encounter: Payer: Self-pay | Admitting: Registered Nurse

## 2015-01-06 DIAGNOSIS — Z5181 Encounter for therapeutic drug level monitoring: Secondary | ICD-10-CM | POA: Insufficient documentation

## 2015-01-06 DIAGNOSIS — M7521 Bicipital tendinitis, right shoulder: Secondary | ICD-10-CM | POA: Insufficient documentation

## 2015-01-06 DIAGNOSIS — G894 Chronic pain syndrome: Secondary | ICD-10-CM | POA: Diagnosis not present

## 2015-01-06 DIAGNOSIS — M7918 Myalgia, other site: Secondary | ICD-10-CM

## 2015-01-06 DIAGNOSIS — Z79899 Other long term (current) drug therapy: Secondary | ICD-10-CM | POA: Insufficient documentation

## 2015-01-06 DIAGNOSIS — M797 Fibromyalgia: Secondary | ICD-10-CM | POA: Diagnosis present

## 2015-01-06 DIAGNOSIS — M654 Radial styloid tenosynovitis [de Quervain]: Secondary | ICD-10-CM | POA: Diagnosis not present

## 2015-01-06 DIAGNOSIS — M722 Plantar fascial fibromatosis: Secondary | ICD-10-CM | POA: Insufficient documentation

## 2015-01-06 DIAGNOSIS — M47812 Spondylosis without myelopathy or radiculopathy, cervical region: Secondary | ICD-10-CM | POA: Insufficient documentation

## 2015-01-06 MED ORDER — FENTANYL 25 MCG/HR TD PT72: 25.0000 ug | MEDICATED_PATCH | TRANSDERMAL | Status: DC

## 2015-01-06 MED ORDER — HYDROCODONE-ACETAMINOPHEN 5-325 MG PO TABS: 1.0000 | ORAL_TABLET | Freq: Three times a day (TID) | ORAL | Status: DC | PRN

## 2015-01-06 NOTE — Progress Notes (Signed)
Subjective:    Patient ID: Alexander Douglas, male    DOB: Dec 15, 1970, 44 y.o.   MRN: [id number]  HPI: Mr. Johanthan Kneeland is a 44 year old male who returns for follow up for chronic pain and medication refill. He says his pain is located in his neck, bilateral shoulder's, left thumb and bilateral feet. He rates his pain 8. His current exercise regime is walking. Our office made a referral to Dr. Ernestina Patches for C4-C-5 and C-5- C-6 bilateral radiofrequency at [facility]. He was seen by Dr. Lorin Mercy had a MRI, he states he's not a surgical candidate.  Manager called [employer] he has an appointment with Dr. Ernestina Patches on 01/12/15.  Pain Inventory Average Pain 6 Pain Right Now 8 My pain is sharp, burning, dull, stabbing and aching  In the last 24 hours, has pain interfered with the following? General activity 6 Relation with others 8 Enjoyment of life 9 What TIME of day is your pain at its worst? morning and evening Sleep (in general) Fair  Pain is worse with: bending, standing and some activites Pain improves with: rest, heat/ice, therapy/exercise, medication, TENS and injections Relief from Meds: 6  Mobility walk without assistance how many minutes can you walk? unlimited ability to climb steps?  yes do you drive?  yes  Function employed # of hrs/week 52  Neuro/Psych numbness tingling anxiety  Prior Studies Any changes since last visit?  yes saw Dr Lorin Mercy who does not do cervical RFs and he ordered MRI and said he was not a surgical candidate.  He was not referred for surgery but for a CRF  Physicians involved in your care Any changes since last visit?  no   Family History  Problem Relation Age of Onset  . Diabetes Mother   . Heart disease Mother   . Hyperlipidemia Mother   . Hypertension Mother    History   Social History  . Marital Status: Single    Spouse Name: N/A  . Number of Children: 0  . Years of Education: 14   Occupational History  .     [employer]   Social History Main Topics  . Smoking status: Never Smoker   . Smokeless tobacco: Current User    Types: Chew     Comment: tobacco  . Alcohol Use: No  . Drug Use: No  . Sexual Activity: Not on file   Other Topics Concern  . None   Social History Narrative   Patient consumes 4 cups of caffeine and is right handed   Past Surgical History  Procedure Laterality Date  . Foot surgery      Left 8/12 right 10/12  . Wisdom tooth extraction    . Neck surgery     Past Medical History  Diagnosis Date  . Hyperlipidemia   . Depression   . Plantar fasciitis   . Migraine   . Anxiety   . Cervical disc disorder    There were no vitals taken for this visit.  Opioid Risk Score:   Fall Risk Score:  (previously educated and given handout)  Review of Systems  Constitutional: Positive for diaphoresis and unexpected weight change.  Neurological: Positive for numbness.       Tingling  Psychiatric/Behavioral: The patient is nervous/anxious.   All other systems reviewed and are negative.      Objective:   Physical Exam  Constitutional: He is oriented to person, place, and time. He appears well-developed and well-nourished.  HENT:  Head:  Normocephalic and atraumatic.  Neck: Normal range of motion. Neck supple.  Cervical Paraspinal Tenderness: C- 4- C-6  Cardiovascular: Normal rate and regular rhythm.   Pulmonary/Chest: Effort normal and breath sounds normal.  Musculoskeletal:  Normal Muscle Bulk and Muscle Testing Reveals: Upper Extremities: Full ROM and Muscle Strength 5/5 Lower Extremities: Full ROM and Muscle strength 5/5 Arises from chair with ease Narrow Based gait  Neurological: He is alert and oriented to person, place, and time.  Skin: Skin is warm and dry.  Psychiatric: He has a normal mood and affect.  Nursing note and vitals reviewed.         Assessment & Plan:  1. Cervical Spondylosis: Refilled: Fentanyl Patches one patch every other day. #15  and HYDROcodone 5/325mg  one tablet three times a day as needed #90. Continue with heat and ice therapy. Referral scheduled: Dr. Ernestina Patches for Bilateral Radiofrequency. Has an appointment March 3,16 for initial consultation. 2. Cervical Radiculopathy: RX: Gabapentin. Instructions given will strt on Friday 11/04/14 when he's off  3. DeQuervains Tenosynovitis: S/P cortisone injection. Continue to monitor. 4.Lumbago: No Complaints today.Continue current medication regime, using Voltaren gel and Zanaflex   20 minutes of face to face patient care was spent during this visit. All questions were encouraged and answered.   F/U in 1 month

## 2015-01-07 LAB — PMP ALCOHOL METABOLITE (ETG)

## 2015-01-11 LAB — OPIATES/OPIOIDS (LC/MS-MS)
Codeine Urine: NEGATIVE ng/mL (ref <50)
HYDROCODONE: 2411 ng/mL (ref <50)
HYDROMORPHONE: 104 ng/mL (ref <50)
Morphine Urine: NEGATIVE ng/mL (ref <50)
NORHYDROCODONE, UR: 5318 ng/mL (ref <50)
Noroxycodone, Ur: NEGATIVE ng/mL (ref <50)
Oxycodone, ur: NEGATIVE ng/mL (ref <50)
Oxymorphone: NEGATIVE ng/mL (ref <50)

## 2015-01-11 LAB — ETHYL GLUCURONIDE, URINE
Ethyl Glucuronide (EtG): 2487 ng/mL — ABNORMAL HIGH (ref <500)
Ethyl Sulfate (ETS): 326 ng/mL — ABNORMAL HIGH (ref <100)

## 2015-01-11 LAB — PRESCRIPTION MONITORING PROFILE (SOLSTAS)
Amphetamine/Meth: NEGATIVE ng/mL
Barbiturate Screen, Urine: NEGATIVE ng/mL
Buprenorphine, Urine: NEGATIVE ng/mL
CARISOPRODOL, URINE: NEGATIVE ng/mL
COCAINE METABOLITES: NEGATIVE ng/mL
CREATININE, URINE: 116.72 mg/dL (ref >20.0)
Cannabinoid Scrn, Ur: NEGATIVE ng/mL
ECSTASY: NEGATIVE ng/mL
METHADONE SCREEN, URINE: NEGATIVE ng/mL
Meperidine, Ur: NEGATIVE ng/mL
Nitrites, Initial: NEGATIVE ug/mL
Oxycodone Screen, Ur: NEGATIVE ng/mL
PH URINE, INITIAL: 6.8 pH (ref 4.5–8.9)
Propoxyphene: NEGATIVE ng/mL
TRAMADOL UR: NEGATIVE ng/mL
Tapentadol, urine: NEGATIVE ng/mL
ZOLPIDEM, URINE: NEGATIVE ng/mL

## 2015-01-11 LAB — BENZODIAZEPINES (GC/LC/MS), URINE
Alprazolam metabolite (GC/LC/MS), ur confirm: 914 ng/mL — AB (ref <25)
CLONAZEPAU: NEGATIVE ng/mL (ref <25)
FLURAZEPAMU: NEGATIVE ng/mL (ref <50)
Lorazepam (GC/LC/MS), ur confirm: NEGATIVE ng/mL (ref <50)
Midazolam (GC/LC/MS), ur confirm: NEGATIVE ng/mL (ref <50)
NORDIAZEPAMU: NEGATIVE ng/mL (ref <50)
OXAZEPAMU: 693 ng/mL — AB (ref <50)
TEMAZEPAMU: 168 ng/mL — AB (ref <50)
Triazolam metabolite (GC/LC/MS), ur confirm: NEGATIVE ng/mL (ref <50)

## 2015-01-11 LAB — FENTANYL (GC/LC/MS), URINE
Fentanyl, confirm: 1.2 ng/mL (ref <0.5)
Norfentanyl, confirm: 37.3 ng/mL (ref <0.5)

## 2015-01-17 ENCOUNTER — Telehealth: Payer: Self-pay | Admitting: *Deleted

## 2015-01-17 NOTE — Telephone Encounter (Signed)
Alexander Douglas called to say that he had to cancel the appt with Dr Ernestina Patches for the burning of his cervical nerves.  He is going to have to wait on it--he has so much going on it is just bad timing for him.

## 2015-01-18 ENCOUNTER — Encounter: Payer: Self-pay | Admitting: Registered Nurse

## 2015-01-18 NOTE — Telephone Encounter (Signed)
Ok. He'll need to be the one responsible for re-scheduling, however.

## 2015-01-18 NOTE — Telephone Encounter (Signed)
I have let him know that.

## 2015-01-27 NOTE — Progress Notes (Signed)
Urine drug screen for this encounter is inconsistent.  Positive for alcohol and unprescribed benzodiazepine(metabolite temazepam and oxazepam. Positive for prescribed medication as well.

## 2015-01-30 ENCOUNTER — Encounter: Payer: Self-pay | Admitting: Registered Nurse

## 2015-01-30 ENCOUNTER — Encounter: Payer: BLUE CROSS/BLUE SHIELD | Attending: Physical Medicine & Rehabilitation | Admitting: Registered Nurse

## 2015-01-30 VITALS — BP 142/96 | HR 86 | Resp 14

## 2015-01-30 DIAGNOSIS — M7918 Myalgia, other site: Secondary | ICD-10-CM

## 2015-01-30 DIAGNOSIS — G894 Chronic pain syndrome: Secondary | ICD-10-CM | POA: Diagnosis present

## 2015-01-30 DIAGNOSIS — M797 Fibromyalgia: Secondary | ICD-10-CM | POA: Insufficient documentation

## 2015-01-30 DIAGNOSIS — Z79899 Other long term (current) drug therapy: Secondary | ICD-10-CM | POA: Insufficient documentation

## 2015-01-30 DIAGNOSIS — M7521 Bicipital tendinitis, right shoulder: Secondary | ICD-10-CM | POA: Insufficient documentation

## 2015-01-30 DIAGNOSIS — M722 Plantar fascial fibromatosis: Secondary | ICD-10-CM | POA: Insufficient documentation

## 2015-01-30 DIAGNOSIS — M47812 Spondylosis without myelopathy or radiculopathy, cervical region: Secondary | ICD-10-CM | POA: Insufficient documentation

## 2015-01-30 DIAGNOSIS — Z5181 Encounter for therapeutic drug level monitoring: Secondary | ICD-10-CM | POA: Diagnosis present

## 2015-01-30 MED ORDER — FENTANYL 25 MCG/HR TD PT72: 25.0000 ug | MEDICATED_PATCH | TRANSDERMAL | Status: DC

## 2015-01-30 MED ORDER — TIZANIDINE HCL 2 MG PO CAPS: 2.0000 mg | ORAL_CAPSULE | Freq: Three times a day (TID) | ORAL | Status: DC | PRN

## 2015-01-30 MED ORDER — HYDROCODONE-ACETAMINOPHEN 5-325 MG PO TABS: 1.0000 | ORAL_TABLET | Freq: Three times a day (TID) | ORAL | Status: DC | PRN

## 2015-01-30 NOTE — Progress Notes (Signed)
Subjective:    Patient ID: Alexander Douglas, male    DOB: 1971/05/10, 44 y.o.   MRN: [id number]  HPI: [name] is a 44 year old male who returns for follow up for chronic pain and medication refill. He says his pain is located in his neck, bilateral shoulder's and left hand. He rates his pain 5. His current exercise regime is walking. Educated on the narcotic policy and ETOH , he verbalizes understanding. Has been given another chance in respect of ETOH he verbalizes understanding. He had a MRI at [facility] he believes he was given valium. He states adamantly he has not taken anyone's valium.  Pain Inventory Average Pain 6 Pain Right Now 5 My pain is constant, sharp, burning, dull, stabbing and aching  In the last 24 hours, has pain interfered with the following? General activity 7 Relation with others 8 Enjoyment of life 9 What TIME of day is your pain at its worst? morning Sleep (in general) Fair  Pain is worse with: walking, bending and some activites Pain improves with: rest, heat/ice, therapy/exercise, medication and TENS Relief from Meds: 7  Mobility walk without assistance how many minutes can you walk? 5 ability to climb steps?  yes do you drive?  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  no  Physicians involved in your care Any changes since last visit?  no   Family History  Problem Relation Age of Onset  . Diabetes Mother   . Heart disease Mother   . Hyperlipidemia Mother   . Hypertension Mother    History   Social History  . Marital Status: Single    Spouse Name: N/A  . Number of Children: 0  . Years of Education: 14   Occupational History  .      [employer]   Social History Main Topics  . Smoking status: Never Smoker   . Smokeless tobacco: Current User    Types: Chew     Comment:  tobacco  . Alcohol Use: No  . Drug Use: No  . Sexual Activity: Not on file   Other Topics Concern  . None   Social History Narrative   Patient consumes 4 cups of caffeine and is right handed   Past Surgical History  Procedure Laterality Date  . Foot surgery      Left 8/12 right 10/12  . Wisdom tooth extraction    . Neck surgery     Past Medical History  Diagnosis Date  . Hyperlipidemia   . Depression   . Plantar fasciitis   . Migraine   . Anxiety   . Cervical disc disorder    BP 142/96 mmHg  Pulse 86  Resp 14  SpO2 99%  Opioid Risk Score:   Fall Risk Score: Low Fall Risk (0-5 points)`1  Depression screen PHQ 2/9  No flowsheet data found.   Review of Systems  Constitutional: Negative.   HENT: Negative.   Eyes: Negative.   Respiratory: Negative.   Cardiovascular: Negative.   Gastrointestinal: Negative.   Endocrine: Negative.   Genitourinary: Negative.   Musculoskeletal: Positive for myalgias, joint swelling and arthralgias.       Both shoulders and hands  Skin: Negative.   Allergic/Immunologic: Negative.   Neurological: Positive for numbness.       Tingling  Hematological: Negative.   Psychiatric/Behavioral: Positive for confusion and  dysphoric mood. The patient is nervous/anxious.        Objective:   Physical Exam  Constitutional: He is oriented to person, place, and time. He appears well-developed and well-nourished.  HENT:  Head: Normocephalic and atraumatic.  Neck: Normal range of motion. Neck supple.  Cardiovascular: Normal rate and regular rhythm.   Pulmonary/Chest: Effort normal and breath sounds normal.  Musculoskeletal:  Normal Muscle Bulk and Muscle Testing Reveals: Upper Extremities: Full ROM and Muscle Strength 5/5 Lower Extremities: Full ROM and Muscle Strength 5/5 Arises from chair with ease Narrow Based Gait  Neurological: He is alert and oriented to person, place, and time.  Skin: Skin is warm and dry.  Psychiatric: He has a  normal mood and affect.  Nursing note and vitals reviewed.         Assessment & Plan:  1. Cervical Spondylosis: Refilled: Fentanyl Patches one patch every other day. #15 and HYDROcodone 5/325mg  one tablet three times a day as needed #90. Continue with heat and ice therapy. 2. Cervical Radiculopathy: Continue Gabapentin.  3.Lumbago: No Complaints today.Continue current medication regime, using Voltaren gel and Zanaflex   20 minutes of face to face patient care was spent during this visit. All questions were encouraged and answered.   F/U in 1 month

## 2015-02-27 ENCOUNTER — Ambulatory Visit: Payer: BLUE CROSS/BLUE SHIELD | Admitting: Physical Medicine & Rehabilitation

## 2015-02-28 ENCOUNTER — Encounter: Payer: BLUE CROSS/BLUE SHIELD | Attending: Physical Medicine & Rehabilitation | Admitting: Registered Nurse

## 2015-02-28 ENCOUNTER — Encounter: Payer: Self-pay | Admitting: Registered Nurse

## 2015-02-28 VITALS — BP 128/81 | HR 104 | Resp 16

## 2015-02-28 DIAGNOSIS — M722 Plantar fascial fibromatosis: Secondary | ICD-10-CM | POA: Diagnosis present

## 2015-02-28 DIAGNOSIS — M7918 Myalgia, other site: Secondary | ICD-10-CM

## 2015-02-28 DIAGNOSIS — G894 Chronic pain syndrome: Secondary | ICD-10-CM | POA: Diagnosis present

## 2015-02-28 DIAGNOSIS — F32A Depression, unspecified: Secondary | ICD-10-CM

## 2015-02-28 DIAGNOSIS — M47812 Spondylosis without myelopathy or radiculopathy, cervical region: Secondary | ICD-10-CM | POA: Diagnosis present

## 2015-02-28 DIAGNOSIS — Z79899 Other long term (current) drug therapy: Secondary | ICD-10-CM | POA: Diagnosis present

## 2015-02-28 DIAGNOSIS — M797 Fibromyalgia: Secondary | ICD-10-CM | POA: Insufficient documentation

## 2015-02-28 DIAGNOSIS — Z5181 Encounter for therapeutic drug level monitoring: Secondary | ICD-10-CM | POA: Diagnosis not present

## 2015-02-28 DIAGNOSIS — F329 Major depressive disorder, single episode, unspecified: Secondary | ICD-10-CM

## 2015-02-28 DIAGNOSIS — M7521 Bicipital tendinitis, right shoulder: Secondary | ICD-10-CM | POA: Insufficient documentation

## 2015-02-28 MED ORDER — HYDROCODONE-ACETAMINOPHEN 5-325 MG PO TABS: 1.0000 | ORAL_TABLET | Freq: Three times a day (TID) | ORAL | Status: DC | PRN

## 2015-02-28 MED ORDER — FENTANYL 25 MCG/HR TD PT72: 25.0000 ug | MEDICATED_PATCH | TRANSDERMAL | Status: DC

## 2015-02-28 NOTE — Progress Notes (Signed)
Subjective:    Patient ID: Alexander Douglas, male    DOB: 1971-10-08, 44 y.o.   MRN: [id number]  HPI: [name] is a 44 year old male who returns for follow up for chronic pain and medication refill. He says his pain is located in his neck, left shoulder, left wrist and bilateral soles of his feet. He rates his pain 5. His current exercise regime is walking. Admits to being depressed about his break up with his girlfriend and seeking christian counseling denies suicidal ideation or plan.   Pain Inventory Average Pain 6 Pain Right Now 5 My pain is sharp, dull and aching  In the last 24 hours, has pain interfered with the following? General activity 5 Relation with others 6 Enjoyment of life 7 What TIME of day is your pain at its worst? morning and night Sleep (in general) Fair  Pain is worse with: inactivity and some activites Pain improves with: rest, heat/ice and medication Relief from Meds: 6  Mobility walk without assistance how many minutes can you walk? a lot with pain ability to climb steps?  yes do you drive?  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  no  Physicians involved in your care Any changes since last visit?  no   Family History  Problem Relation Age of Onset  . Diabetes Mother   . Heart disease Mother   . Hyperlipidemia Mother   . Hypertension Mother    History   Social History  . Marital Status: Single    Spouse Name: N/A  . Number of Children: 0  . Years of Education: 14   Occupational History  .      [employer]   Social History Main Topics  . Smoking status: Never Smoker   . Smokeless tobacco: Current User    Types: Chew     Comment: tobacco  . Alcohol Use: No  . Drug Use: No  . Sexual Activity: Not on file   Other Topics Concern  . None   Social History Narrative   Patient consumes  4 cups of caffeine and is right handed   Past Surgical History  Procedure Laterality Date  . Foot surgery      Left 8/12 right 10/12  . Wisdom tooth extraction    . Neck surgery     Past Medical History  Diagnosis Date  . Hyperlipidemia   . Depression   . Plantar fasciitis   . Migraine   . Anxiety   . Cervical disc disorder    BP 128/81 mmHg  Pulse 104  Resp 16  SpO2 93%  Opioid Risk Score:   Fall Risk Score: Low Fall Risk (0-5 points)`1  Depression screen PHQ 2/9  Depression screen PHQ 2/9 01/30/2015  Decreased Interest 1  Down, Depressed, Hopeless 1  PHQ - 2 Score 2  Altered sleeping 2  Tired, decreased energy 2  Change in appetite 0  Feeling bad or failure about yourself  0  Trouble concentrating 0  Moving slowly or fidgety/restless 0  PHQ-9 Score 6      Review of Systems  Constitutional: Positive for diaphoresis and unexpected weight change.  Neurological: Positive for numbness.       Tingling  Psychiatric/Behavioral: The patient is nervous/anxious.   All other systems reviewed and are negative.      Objective:   Physical Exam  Constitutional: He is oriented to person, place, and time. He appears well-developed and well-nourished.  HENT:  Head: Normocephalic and atraumatic.  Neck: Normal range of motion. Neck supple.  Cervical Paraspinal Tenderness: C-5- C-6  Cardiovascular: Normal rate and regular rhythm.   Pulmonary/Chest: Effort normal and breath sounds normal.  Musculoskeletal:  Normal Muscle Bulk and Muscle Testing Reveals: Upper Extremities: Full ROM and Muscle Strength 5/5 Lower Extremities: Full ROM and Muscle Strength 5/5 Arises from chair with ease Narrow Based Gait  Neurological: He is alert and oriented to person, place, and time.  Skin: Skin is warm and dry.  Psychiatric: He has a normal mood and affect.  Nursing note and vitals reviewed.         Assessment & Plan:  1. Cervical Spondylosis: Refilled: Fentanyl Patches one  patch every other day. #15 and HYDROcodone 5/325mg  one tablet three times a day as needed #90. Continue with heat and ice therapy. 2. Cervical Radiculopathy: Continue Gabapentin.  3.Lumbago: No Complaints today.Continue current medication regime, using Voltaren gel and Zanaflex  4. Depression: RX: Counseling 20 minutes of face to face patient care was spent during this visit. All questions were encouraged and answered.   F/U in 1 month

## 2015-03-28 ENCOUNTER — Encounter: Payer: BLUE CROSS/BLUE SHIELD | Attending: Physical Medicine & Rehabilitation | Admitting: Registered Nurse

## 2015-03-28 ENCOUNTER — Other Ambulatory Visit: Payer: Self-pay | Admitting: Registered Nurse

## 2015-03-28 ENCOUNTER — Encounter: Payer: Self-pay | Admitting: Registered Nurse

## 2015-03-28 VITALS — BP 133/94 | HR 82 | Resp 14

## 2015-03-28 DIAGNOSIS — M47812 Spondylosis without myelopathy or radiculopathy, cervical region: Secondary | ICD-10-CM | POA: Diagnosis present

## 2015-03-28 DIAGNOSIS — M7521 Bicipital tendinitis, right shoulder: Secondary | ICD-10-CM | POA: Insufficient documentation

## 2015-03-28 DIAGNOSIS — M797 Fibromyalgia: Secondary | ICD-10-CM

## 2015-03-28 DIAGNOSIS — Z79899 Other long term (current) drug therapy: Secondary | ICD-10-CM | POA: Diagnosis present

## 2015-03-28 DIAGNOSIS — M722 Plantar fascial fibromatosis: Secondary | ICD-10-CM | POA: Diagnosis present

## 2015-03-28 DIAGNOSIS — F329 Major depressive disorder, single episode, unspecified: Secondary | ICD-10-CM

## 2015-03-28 DIAGNOSIS — G894 Chronic pain syndrome: Secondary | ICD-10-CM | POA: Diagnosis not present

## 2015-03-28 DIAGNOSIS — M654 Radial styloid tenosynovitis [de Quervain]: Secondary | ICD-10-CM

## 2015-03-28 DIAGNOSIS — M25512 Pain in left shoulder: Secondary | ICD-10-CM

## 2015-03-28 DIAGNOSIS — Z5181 Encounter for therapeutic drug level monitoring: Secondary | ICD-10-CM

## 2015-03-28 DIAGNOSIS — M62838 Other muscle spasm: Secondary | ICD-10-CM

## 2015-03-28 DIAGNOSIS — F32A Depression, unspecified: Secondary | ICD-10-CM

## 2015-03-28 DIAGNOSIS — M7918 Myalgia, other site: Secondary | ICD-10-CM

## 2015-03-28 MED ORDER — DICLOFENAC SODIUM 1 % TD GEL: 2.0000 g | Freq: Four times a day (QID) | TRANSDERMAL | Status: DC

## 2015-03-28 MED ORDER — HYDROCODONE-ACETAMINOPHEN 5-325 MG PO TABS: 1.0000 | ORAL_TABLET | Freq: Three times a day (TID) | ORAL | Status: DC | PRN

## 2015-03-28 MED ORDER — CYCLOBENZAPRINE HCL 5 MG PO TABS: 5.0000 mg | ORAL_TABLET | Freq: Three times a day (TID) | ORAL | Status: DC | PRN

## 2015-03-28 MED ORDER — METHYLPREDNISOLONE 4 MG PO TBPK: ORAL_TABLET | ORAL | Status: DC

## 2015-03-28 MED ORDER — FENTANYL 25 MCG/HR TD PT72: 25.0000 ug | MEDICATED_PATCH | TRANSDERMAL | Status: DC

## 2015-03-28 NOTE — Progress Notes (Signed)
Subjective:    Patient ID: Alexander Douglas, male    DOB: 03/07/1971, 45 y.o.   MRN: [id number]  HPI: [name] is a 44 year old male who returns for follow up for chronic pain and medication refill. He says his pain is located in his neck and left shoulder. He rates his pain 6. His current exercise regime is walking. Also states he found a Marketing executive, he needs to make an appointment. Encouraged to call he verbalizes understanding.  Pain Inventory Average Pain 5 Pain Right Now 6 My pain is sharp, dull, tingling and aching  In the last 24 hours, has pain interfered with the following? General activity 7 Relation with others 8 Enjoyment of life 9 What TIME of day is your pain at its worst? morning and night Sleep (in general) NA  Pain is worse with: bending, inactivity and some activites Pain improves with: rest, heat/ice, medication and injections Relief from Meds: no answer  Mobility walk without assistance how many minutes can you walk? a lot with pain ability to climb steps?  yes do you drive?  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  no  Physicians involved in your care Any changes since last visit?  no   Family History  Problem Relation Age of Onset  . Diabetes Mother   . Heart disease Mother   . Hyperlipidemia Mother   . Hypertension Mother    History   Social History  . Marital Status: Single    Spouse Name: N/A  . Number of Children: 0  . Years of Education: 14   Occupational History  .      [employer]   Social History Main Topics  . Smoking status: Never Smoker   . Smokeless tobacco: Current User    Types: Chew     Comment: tobacco  . Alcohol Use: No  . Drug Use: No  . Sexual Activity: Not on file   Other Topics Concern  . None   Social History Narrative   Patient  consumes 4 cups of caffeine and is right handed   Past Surgical History  Procedure Laterality Date  . Foot surgery      Left 8/12 right 10/12  . Wisdom tooth extraction    . Neck surgery     Past Medical History  Diagnosis Date  . Hyperlipidemia   . Depression   . Plantar fasciitis   . Migraine   . Anxiety   . Cervical disc disorder    BP 133/94   P 82   Sat 100%    R 14 Opioid Risk Score:   Fall Risk Score: Low Fall Risk (0-5 points) (previoulsy educated and given handout)`1  Depression screen PHQ 2/9  Depression screen PHQ 2/9 01/30/2015  Decreased Interest 1  Down, Depressed, Hopeless 1  PHQ - 2 Score 2  Altered sleeping 2  Tired, decreased energy 2  Change in appetite 0  Feeling bad or failure about yourself  0  Trouble concentrating 0  Moving slowly or fidgety/restless 0  PHQ-9 Score 6      Review of Systems  Constitutional: Positive for diaphoresis and appetite change.  Musculoskeletal:       Spasms  Neurological: Positive for numbness.       Tingling  Psychiatric/Behavioral: The patient is nervous/anxious.   All other systems  reviewed and are negative.      Objective:   Physical Exam  Constitutional: He is oriented to person, place, and time. He appears well-developed and well-nourished.  HENT:  Head: Normocephalic and atraumatic.  Neck: Normal range of motion. Neck supple.  Cervical Tenderness: C-5- C-6  Cardiovascular: Normal rate and regular rhythm.   Pulmonary/Chest: Effort normal and breath sounds normal.  Musculoskeletal:  Normal Muscle Bulk and Muscle Testing Reveals: Upper Extremities: Full ROM and Muscle Strength 5/5 Lumbar Paraspinal Tenderness: L-3- L-5 Lower Extremities: Full ROM and Muscle Strength 5/5 Arises from chair with ease Narrow Based Gait  Neurological: He is alert and oriented to person, place, and time.  Skin: Skin is warm and dry.  Psychiatric: He has a normal mood and affect.  Nursing note and vitals  reviewed.         Assessment & Plan:  1. Cervical Spondylosis: Refilled: Fentanyl Patches one patch every other day. #15 and HYDROcodone 5/325mg  one tablet three times a day as needed #90. Continue with heat and ice therapy. 2. Cervical Radiculopathy: Continue Gabapentin.  3.Lumbago: Continue current medication regime, using Voltaren gel 4. Depression: Encouraged to call counselor 5.Muscle Spasms: Zanaflex Discontinued Ineffective : RX: Flexeril 6. De Quervains tenosynovitis left wrist: RX: Medrol Dose pak  20 minutes of face to face patient care was spent during this visit. All questions were encouraged and answered.   F/U in 1 month

## 2015-03-29 ENCOUNTER — Ambulatory Visit: Payer: BLUE CROSS/BLUE SHIELD | Admitting: Physical Medicine & Rehabilitation

## 2015-03-29 LAB — PMP ALCOHOL METABOLITE (ETG)

## 2015-03-31 LAB — PRESCRIPTION MONITORING PROFILE (SOLSTAS)
Amphetamine/Meth: NEGATIVE ng/mL
BUPRENORPHINE, URINE: NEGATIVE ng/mL
Barbiturate Screen, Urine: NEGATIVE ng/mL
CANNABINOID SCRN UR: NEGATIVE ng/mL
Carisoprodol, Urine: NEGATIVE ng/mL
Cocaine Metabolites: NEGATIVE ng/mL
Creatinine, Urine: 152.3 mg/dL (ref >20.0)
ECSTASY: NEGATIVE ng/mL
MEPERIDINE UR: NEGATIVE ng/mL
Methadone Screen, Urine: NEGATIVE ng/mL
NITRITES URINE, INITIAL: NEGATIVE ug/mL
Oxycodone Screen, Ur: NEGATIVE ng/mL
PROPOXYPHENE: NEGATIVE ng/mL
Tapentadol, urine: NEGATIVE ng/mL
Tramadol Scrn, Ur: NEGATIVE ng/mL
Zolpidem, Urine: NEGATIVE ng/mL
pH, Initial: 6.1 pH (ref 4.5–8.9)

## 2015-03-31 LAB — OPIATES/OPIOIDS (LC/MS-MS)
Codeine Urine: NEGATIVE ng/mL (ref <50)
Hydrocodone: 859 ng/mL (ref <50)
Hydromorphone: 68 ng/mL (ref <50)
MORPHINE: NEGATIVE ng/mL (ref <50)
NOROXYCODONE, UR: NEGATIVE ng/mL (ref <50)
Norhydrocodone, Ur: 2057 ng/mL (ref <50)
OXYMORPHONE, URINE: NEGATIVE ng/mL (ref <50)
Oxycodone, ur: NEGATIVE ng/mL (ref <50)

## 2015-03-31 LAB — BENZODIAZEPINES (GC/LC/MS), URINE
ALPRAZOLAMU: 820 ng/mL (ref <25)
Clonazepam metabolite (GC/LC/MS), ur confirm: NEGATIVE ng/mL (ref <25)
Flurazepam metabolite (GC/LC/MS), ur confirm: NEGATIVE ng/mL (ref <50)
LORAZEPAMU: NEGATIVE ng/mL (ref <50)
MIDAZOLAMU: NEGATIVE ng/mL (ref <50)
NORDIAZEPAMU: NEGATIVE ng/mL (ref <50)
OXAZEPAMU: NEGATIVE ng/mL (ref <50)
TRIAZOLAMU: NEGATIVE ng/mL (ref <50)
Temazepam (GC/LC/MS), ur confirm: NEGATIVE ng/mL (ref <50)

## 2015-03-31 LAB — FENTANYL (GC/LC/MS), URINE
FENTANYL (GC/MS) CONFIRM: 43.6 ng/mL (ref <0.5)
NORFENTANYL (GC/MS) CONFIRM: 397.6 ng/mL (ref <0.5)

## 2015-03-31 LAB — ETHYL GLUCURONIDE, URINE
ETHYL SULFATE (ETS): 532 ng/mL — AB (ref <100)
Ethyl Glucuronide (EtG): 3562 ng/mL — ABNORMAL HIGH (ref <500)

## 2015-04-12 NOTE — Progress Notes (Signed)
Urine drug screen for this encounter is inconsistent.  It is positive for prescribed medication but also positive for alcohol, and this is the second positive test this year.

## 2015-04-24 ENCOUNTER — Telehealth: Payer: Self-pay | Admitting: Physical Medicine & Rehabilitation

## 2015-04-24 ENCOUNTER — Encounter: Payer: BLUE CROSS/BLUE SHIELD | Admitting: Registered Nurse

## 2015-05-01 ENCOUNTER — Encounter: Payer: Self-pay | Admitting: Registered Nurse

## 2015-05-01 ENCOUNTER — Encounter: Payer: BLUE CROSS/BLUE SHIELD | Attending: Physical Medicine & Rehabilitation | Admitting: Registered Nurse

## 2015-05-01 VITALS — BP 120/88 | HR 103 | Resp 16

## 2015-05-01 DIAGNOSIS — M47812 Spondylosis without myelopathy or radiculopathy, cervical region: Secondary | ICD-10-CM | POA: Diagnosis not present

## 2015-05-01 DIAGNOSIS — F329 Major depressive disorder, single episode, unspecified: Secondary | ICD-10-CM | POA: Diagnosis not present

## 2015-05-01 DIAGNOSIS — Z79899 Other long term (current) drug therapy: Secondary | ICD-10-CM | POA: Diagnosis present

## 2015-05-01 DIAGNOSIS — G894 Chronic pain syndrome: Secondary | ICD-10-CM | POA: Diagnosis present

## 2015-05-01 DIAGNOSIS — Z5181 Encounter for therapeutic drug level monitoring: Secondary | ICD-10-CM | POA: Diagnosis present

## 2015-05-01 DIAGNOSIS — M797 Fibromyalgia: Secondary | ICD-10-CM | POA: Diagnosis not present

## 2015-05-01 DIAGNOSIS — M722 Plantar fascial fibromatosis: Secondary | ICD-10-CM | POA: Insufficient documentation

## 2015-05-01 DIAGNOSIS — M7521 Bicipital tendinitis, right shoulder: Secondary | ICD-10-CM | POA: Diagnosis present

## 2015-05-01 DIAGNOSIS — M7918 Myalgia, other site: Secondary | ICD-10-CM

## 2015-05-01 DIAGNOSIS — F32A Depression, unspecified: Secondary | ICD-10-CM

## 2015-05-01 MED ORDER — FENTANYL 25 MCG/HR TD PT72: 25.0000 ug | MEDICATED_PATCH | TRANSDERMAL | Status: DC

## 2015-05-01 MED ORDER — HYDROCODONE-ACETAMINOPHEN 5-325 MG PO TABS: 1.0000 | ORAL_TABLET | Freq: Three times a day (TID) | ORAL | Status: DC | PRN

## 2015-05-01 MED ORDER — GABAPENTIN 100 MG PO CAPS: 100.0000 mg | ORAL_CAPSULE | Freq: Three times a day (TID) | ORAL | Status: DC

## 2015-05-01 NOTE — Progress Notes (Signed)
Subjective:    Patient ID: Alexander Douglas, male    DOB: 05/22/1971, 44 y.o.   MRN: [id number]  HPI: [name] is a 44 year old male who returns for follow up for chronic pain and medication refill. He says his pain is located in his neck. He rates his pain 6. His current exercise regime is walking.  Pain Inventory Average Pain 6 Pain Right Now 6 My pain is dull, stabbing, tingling and aching  In the last 24 hours, has pain interfered with the following? General activity 6 Relation with others 7 Enjoyment of life 8 What TIME of day is your pain at its worst? morning and night Sleep (in general) Fair  Pain is worse with: bending, inactivity and some activites Pain improves with: rest, heat/ice, medication and injections Relief from Meds: no answer  Mobility walk without assistance how many minutes can you walk? "a lot with pain" ability to climb steps?  yes do you drive?  yes  Function employed # of hrs/week 48-52  Neuro/Psych numbness tingling anxiety  Prior Studies Any changes since last visit?  no  Physicians involved in your care Any changes since last visit?  yes   Family History  Problem Relation Age of Onset  . Diabetes Mother   . Heart disease Mother   . Hyperlipidemia Mother   . Hypertension Mother    History   Social History  . Marital Status: Single    Spouse Name: N/A  . Number of Children: 0  . Years of Education: 14   Occupational History  .      [employer]   Social History Main Topics  . Smoking status: Never Smoker   . Smokeless tobacco: Current User    Types: Chew     Comment: tobacco  . Alcohol Use: No  . Drug Use: No  . Sexual Activity: Not on file   Other Topics Concern  . None   Social History Narrative   Patient consumes 4 cups of caffeine and is right handed   Past Surgical History  Procedure Laterality Date  . Foot surgery      Left 8/12 right 10/12  . Wisdom tooth extraction    . Neck surgery      Past Medical History  Diagnosis Date  . Hyperlipidemia   . Depression   . Plantar fasciitis   . Migraine   . Anxiety   . Cervical disc disorder    BP 120/88 mmHg  Pulse 103  Resp 16  SpO2 100%  Opioid Risk Score:   Fall Risk Score: Low Fall Risk (0-5 points) (previously educated and given handout)`1  Depression screen PHQ 2/9  Depression screen PHQ 2/9 01/30/2015  Decreased Interest 1  Down, Depressed, Hopeless 1  PHQ - 2 Score 2  Altered sleeping 2  Tired, decreased energy 2  Change in appetite 0  Feeling bad or failure about yourself  0  Trouble concentrating 0  Moving slowly or fidgety/restless 0  PHQ-9 Score 6     Review of Systems  Constitutional: Positive for diaphoresis.  Neurological: Positive for numbness.       Tingling  Psychiatric/Behavioral: The patient is nervous/anxious.   All other systems reviewed and are negative.      Objective:   Physical Exam  Constitutional: He is oriented to person, place, and time. He appears well-developed and well-nourished.  HENT:  Head: Normocephalic and atraumatic.  Neck: Normal range of motion. Neck supple.  Cervical Paraspinal  Tenderness: C-3- C-6  Mainly Left Side  Cardiovascular: Normal rate and regular rhythm.   Pulmonary/Chest: Effort normal and breath sounds normal.  Musculoskeletal:  Normal Muscle Bulk and Muscle Testing Reveals: Upper Extremities: Full ROM and Muscle Strength 5/5 Lower Extremities: Full ROM and Muscle Strength 5/5 Arises from chair with ease Narrow Based Gait   Neurological: He is alert and oriented to person, place, and time.  Skin: Skin is warm and dry.  Psychiatric: He has a normal mood and affect.  Nursing note and vitals reviewed.         Assessment & Plan:  1. Cervical Spondylosis: Refilled: Fentanyl Patches one patch every other day. #15 and HYDROcodone 5/325mg  one tablet three times a day as needed #90. Continue with heat and ice therapy. 2. Cervical Radiculopathy:  Continue Gabapentin.  3.Lumbago: No complaints today:  Continue current medication regime, using Voltaren gel 4.Muscle Spasms: Continued Flexeril 5. Depression: Continue Wellbutrin  20 minutes of face to face patient care was spent during this visit. All questions were encouraged and answered.   F/U in 1 month

## 2015-05-03 ENCOUNTER — Ambulatory Visit: Payer: BLUE CROSS/BLUE SHIELD | Admitting: Physical Medicine & Rehabilitation

## 2015-05-29 ENCOUNTER — Encounter: Payer: Self-pay | Admitting: Physical Medicine & Rehabilitation

## 2015-05-29 ENCOUNTER — Encounter
Payer: BLUE CROSS/BLUE SHIELD | Attending: Physical Medicine & Rehabilitation | Admitting: Physical Medicine & Rehabilitation

## 2015-05-29 VITALS — BP 143/82 | HR 86 | Resp 14

## 2015-05-29 DIAGNOSIS — M4692 Unspecified inflammatory spondylopathy, cervical region: Secondary | ICD-10-CM

## 2015-05-29 DIAGNOSIS — M7521 Bicipital tendinitis, right shoulder: Secondary | ICD-10-CM | POA: Insufficient documentation

## 2015-05-29 DIAGNOSIS — M654 Radial styloid tenosynovitis [de Quervain]: Secondary | ICD-10-CM

## 2015-05-29 DIAGNOSIS — M797 Fibromyalgia: Secondary | ICD-10-CM

## 2015-05-29 DIAGNOSIS — M7918 Myalgia, other site: Secondary | ICD-10-CM

## 2015-05-29 DIAGNOSIS — G894 Chronic pain syndrome: Secondary | ICD-10-CM | POA: Diagnosis present

## 2015-05-29 DIAGNOSIS — M722 Plantar fascial fibromatosis: Secondary | ICD-10-CM | POA: Diagnosis present

## 2015-05-29 DIAGNOSIS — Z79899 Other long term (current) drug therapy: Secondary | ICD-10-CM | POA: Insufficient documentation

## 2015-05-29 DIAGNOSIS — Z5181 Encounter for therapeutic drug level monitoring: Secondary | ICD-10-CM | POA: Insufficient documentation

## 2015-05-29 DIAGNOSIS — M47812 Spondylosis without myelopathy or radiculopathy, cervical region: Secondary | ICD-10-CM

## 2015-05-29 MED ORDER — FENTANYL 25 MCG/HR TD PT72: 25.0000 ug | MEDICATED_PATCH | TRANSDERMAL | Status: DC

## 2015-05-29 MED ORDER — HYDROCODONE-ACETAMINOPHEN 5-325 MG PO TABS: 1.0000 | ORAL_TABLET | Freq: Three times a day (TID) | ORAL | Status: DC | PRN

## 2015-05-29 NOTE — Progress Notes (Signed)
Subjective:    Patient ID: Alexander Douglas, male    DOB: 1971-10-23, 44 y.o.   MRN: [id number]  HPI   Alexander Douglas is here in follow up of his chronic pain. He decided to hold off on RF's for financial reasons at this point---additonally he ended up seeing Dr. Lorin Douglas instead of Alexander Douglas. His neck pain has typically been around a 6---sometimes a 7 depending upon how much he's working. He's been working more because of Alexander Douglas.   He feels that his fentanyl patch is effective along with his hydrocodone for pain control. The patch sometims will come loose but he typically tapes it on so that it adheres.   His wrist pain is better but now affecting the other side of his wrists.     Pain Inventory Average Pain 6 Pain Right Now 7 My pain is sharp, dull, stabbing, tingling and aching  In the last 24 hours, has pain interfered with the following? General activity 5 Relation with others 7 Enjoyment of life 8 What TIME of day is your pain at its worst? morning,night Sleep (in general) Fair  Pain is worse with: walking, inactivity, standing and some activites Pain improves with: rest, heat/ice and medication Relief from Meds: 5  Mobility walk without assistance how many minutes can you walk? unlimited w/ pain ability to climb steps?  yes do you drive?  yes Do you have any goals in this area?  no  Function employed # of hrs/week . Do you have any goals in this area?  no  Neuro/Psych tingling spasms anxiety  Prior Studies Any changes since last visit?  no  Physicians involved in your care Any changes since last visit?  no   Family History  Problem Relation Age of Onset  . Diabetes Mother   . Heart disease Mother   . Hyperlipidemia Mother   . Hypertension Mother    History   Social History  . Marital Status: Single    Spouse Name: N/A  . Number of Children: 0  . Years of Education: 14   Occupational History  .      [employer]   Social History Main Topics   . Smoking status: Never Smoker   . Smokeless tobacco: Current User    Types: Chew     Comment: tobacco  . Alcohol Use: No  . Drug Use: No  . Sexual Activity: Not on file   Other Topics Concern  . None   Social History Narrative   Patient consumes 4 cups of caffeine and is right handed   Past Surgical History  Procedure Laterality Date  . Foot surgery      Left 8/12 right 10/12  . Wisdom tooth extraction    . Neck surgery     Past Medical History  Diagnosis Date  . Hyperlipidemia   . Depression   . Plantar fasciitis   . Migraine   . Anxiety   . Cervical disc disorder    BP 143/82 mmHg  Pulse 86  Resp 14  SpO2 100%  Opioid Alexander Score:   Fall Alexander Score: Low Fall Alexander (0-5 points)`1  Depression screen PHQ 2/9  Depression screen PHQ 2/9 01/30/2015  Decreased Interest 1  Down, Depressed, Hopeless 1  PHQ - 2 Score 2  Altered sleeping 2  Tired, decreased energy 2  Change in appetite 0  Feeling bad or failure about yourself  0  Trouble concentrating 0  Moving slowly or fidgety/restless 0  PHQ-9 Score  6     Review of Systems  Neurological:       Tingling Spasms   Psychiatric/Behavioral: The patient is nervous/anxious.   All other systems reviewed and are negative.      Objective:   Physical Exam  General: Alert and oriented x 3, No apparent distress  HEENT: Head is normocephalic, atraumatic, PERRLA, EOMI, sclera anicteric, oral mucosa pink and moist, dentition intact, ext ear canals clear,  Neck: Supple without JVD or lymphadenopathy  Heart: Reg rate and rhythm. No murmurs rubs or gallops  Chest: CTA bilaterally without wheezes, rales, or rhonchi; no distress  Abdomen: Soft, non-tender, non-distended, bowel sounds positive.  Extremities: No clubbing, cyanosis, or edema. Pulses are 2+  Skin: Clean and intact without signs of breakdown  Neuro: Pt is cognitively appropriate with normal insight, memory, and awareness. Cranial nerves 2-12 are intact.  Sensory exam is normal. Reflexes are 2+ in all 4's. Fine motor coordination is intact. No tremors. Motor function is grossly 5/5.  Musculoskeletal:  Traps aren't as  tight. Almost all neck pain with extension. Facet maneuvers positive. Both medial arches are tender with scarring noted along the flexor tendons and arch. Gait slightly antalgic. He has less pain with palpation along the APL tendon. He has pain along the ulnar wrist as well. Psych: Pt's affect is appropriate. Pt is cooperative     Assessment & Plan:   1. Chronic plantar fasciitis with plantar fibroids  2. Right biceps tendonitis, labral injury  3. Cervical spondylosis, ?facet arthopathy  4. Shift sleep disorder  5. De Quervain's Tenosynovitis: Left hand     Plan:  1. Continue voltaren gel to both feet, right shoulder  2. He will pursue RF's at a later point (C4-5, C5-6) when he's ready from a pain/financial standpoint.  3. Continue fentanyl to 39mcg q48 hours and hydrocodone. rxes were refilled today. Consider switching to oral long acting agent given his sweating/adhesion issues---however patch is working so we'll stay with it for now. 4. Discussed brace wear and appropriate use 5. Continue with zanaflex before sleep.  6. Naproxen 2 tabs BID prn with food.  7. Follow up with NP in about a month. 15 minutes of face to face patient care time were spent during this visit. All questions were encouraged and answered.

## 2015-05-29 NOTE — Patient Instructions (Signed)
REMEMBER TO PACE YOURSELF AND BE REASONABLE ABOUT YOUR ACTIVITIES.Marland Kitchen

## 2015-06-02 ENCOUNTER — Telehealth: Payer: Self-pay | Admitting: *Deleted

## 2015-06-02 NOTE — Telephone Encounter (Signed)
Patient was seen and recd his prescription

## 2015-06-29 ENCOUNTER — Encounter: Payer: Self-pay | Admitting: Registered Nurse

## 2015-06-29 ENCOUNTER — Encounter: Payer: BLUE CROSS/BLUE SHIELD | Attending: Physical Medicine & Rehabilitation | Admitting: Registered Nurse

## 2015-06-29 VITALS — BP 148/98 | HR 69

## 2015-06-29 DIAGNOSIS — M797 Fibromyalgia: Secondary | ICD-10-CM | POA: Insufficient documentation

## 2015-06-29 DIAGNOSIS — M654 Radial styloid tenosynovitis [de Quervain]: Secondary | ICD-10-CM

## 2015-06-29 DIAGNOSIS — M7918 Myalgia, other site: Secondary | ICD-10-CM

## 2015-06-29 DIAGNOSIS — M25512 Pain in left shoulder: Secondary | ICD-10-CM | POA: Diagnosis not present

## 2015-06-29 DIAGNOSIS — M47812 Spondylosis without myelopathy or radiculopathy, cervical region: Secondary | ICD-10-CM | POA: Diagnosis present

## 2015-06-29 DIAGNOSIS — Z79899 Other long term (current) drug therapy: Secondary | ICD-10-CM | POA: Insufficient documentation

## 2015-06-29 DIAGNOSIS — M7521 Bicipital tendinitis, right shoulder: Secondary | ICD-10-CM | POA: Insufficient documentation

## 2015-06-29 DIAGNOSIS — M722 Plantar fascial fibromatosis: Secondary | ICD-10-CM | POA: Insufficient documentation

## 2015-06-29 DIAGNOSIS — G894 Chronic pain syndrome: Secondary | ICD-10-CM | POA: Insufficient documentation

## 2015-06-29 DIAGNOSIS — Z5181 Encounter for therapeutic drug level monitoring: Secondary | ICD-10-CM | POA: Diagnosis present

## 2015-06-29 MED ORDER — HYDROCODONE-ACETAMINOPHEN 5-325 MG PO TABS: 1.0000 | ORAL_TABLET | Freq: Three times a day (TID) | ORAL | Status: DC | PRN

## 2015-06-29 MED ORDER — FENTANYL 25 MCG/HR TD PT72: 25.0000 ug | MEDICATED_PATCH | TRANSDERMAL | Status: DC

## 2015-06-29 NOTE — Progress Notes (Signed)
Subjective:    Patient ID: Alexander Douglas, male    DOB: 09-07-1971, 44 y.o.   MRN: [id number]  HPI: [name] is a 44 year old male who returns for follow up for chronic pain and medication refill. He says his pain is located in his neck and left shoulder. He rates his pain 8. His current exercise regime is walking.   Pain Inventory Average Pain 6 Pain Right Now 8 My pain is dull, stabbing, tingling and aching  In the last 24 hours, has pain interfered with the following? General activity 6 Relation with others 7 Enjoyment of life 8 What TIME of day is your pain at its worst? morning and night Sleep (in general) Fair  Pain is worse with: walking, bending, inactivity, standing and some activites Pain improves with: rest, heat/ice, therapy/exercise and medication Relief from Meds: 6  Mobility Do you have any goals in this area?  no  Function employed # of hrs/week 60 Do you have any goals in this area?  no  Neuro/Psych numbness tingling spasms anxiety  Prior Studies Any changes since last visit?  no  Physicians involved in your care Any changes since last visit?  no   Family History  Problem Relation Age of Onset  . Diabetes Mother   . Heart disease Mother   . Hyperlipidemia Mother   . Hypertension Mother    Social History   Social History  . Marital Status: Single    Spouse Name: N/A  . Number of Children: 0  . Years of Education: 14   Occupational History  .      [employer]   Social History Main Topics  . Smoking status: Never Smoker   . Smokeless tobacco: Current User    Types: Chew     Comment: tobacco  . Alcohol Use: No  . Drug Use: No  . Sexual Activity: Not Asked   Other Topics Concern  . None   Social History Narrative   Patient consumes 4 cups of caffeine and is right handed   Past Surgical History  Procedure Laterality Date  . Foot surgery      Left 8/12 right 10/12  . Wisdom tooth extraction    . Neck  surgery     Past Medical History  Diagnosis Date  . Hyperlipidemia   . Depression   . Plantar fasciitis   . Migraine   . Anxiety   . Cervical disc disorder    BP 159/93 mmHg  Pulse 69  SpO2 100%  Opioid Risk Score:   Fall Risk Score:  `1  Depression screen PHQ 2/9  Depression screen [facility] 2/9 06/29/2015 01/30/2015  Decreased Interest 1 1  Down, Depressed, Hopeless 0 1  PHQ - 2 Score 1 2  Altered sleeping - 2  Tired, decreased energy - 2  Change in appetite - 0  Feeling bad or failure about yourself  - 0  Trouble concentrating - 0  Moving slowly or fidgety/restless - 0  PHQ-9 Score - 6      Review of Systems  Neurological: Positive for numbness.       Tingling and spasms  Psychiatric/Behavioral: Positive for agitation.  All other systems reviewed and are negative.      Objective:   Physical Exam  Constitutional: He is oriented to person, place, and time. He appears well-developed and well-nourished.  HENT:  Head: Normocephalic and atraumatic.  Neck: Normal range of motion. Neck supple.  Cervical Paraspinal Tenderness: C-5-  C-6  Cardiovascular: Normal rate and regular rhythm.   Pulmonary/Chest: Effort normal and breath sounds normal.  Musculoskeletal:  Normal Muscle Bulk and Muscle Testing Reveals: Upper Extremities: Full ROM and Muscle Strength 5/5 Bilateral AC Joint Tenderness Thoracic Paraspinal Tenderness: T-1- T-3 Mainly Left Side Lower Extremities: Full ROM and Muscle Strength 5/5 Arises from chair with ease Narrow Based Gait  Neurological: He is alert and oriented to person, place, and time.  Skin: Skin is warm and dry.  Psychiatric: He has a normal mood and affect.  Nursing note and vitals reviewed.         Assessment & Plan:  1. Cervical Spondylosis: Refilled: Fentanyl Patches one patch every other day. #15 and HYDROcodone 5/325mg  one tablet three times a day as needed #90. Continue with heat and ice therapy. 2. Cervical Radiculopathy:  Continue Gabapentin.  3.Lumbago: No complaints today: Continue current medication regime, using Voltaren gel 4.Muscle Spasms: Continued Flexeril 5. Depression: Continue Wellbutrin  20 minutes of face to face patient care was spent during this visit. All questions were encouraged and answered.   F/U in 1 month

## 2015-07-21 ENCOUNTER — Other Ambulatory Visit: Payer: Self-pay | Admitting: *Deleted

## 2015-07-21 ENCOUNTER — Telehealth: Payer: Self-pay | Admitting: *Deleted

## 2015-07-21 MED ORDER — CYCLOBENZAPRINE HCL 5 MG PO TABS: 5.0000 mg | ORAL_TABLET | Freq: Three times a day (TID) | ORAL | Status: DC | PRN

## 2015-07-21 NOTE — Telephone Encounter (Signed)
Error

## 2015-07-24 ENCOUNTER — Encounter: Payer: BLUE CROSS/BLUE SHIELD | Attending: Physical Medicine & Rehabilitation | Admitting: Registered Nurse

## 2015-07-24 DIAGNOSIS — G894 Chronic pain syndrome: Secondary | ICD-10-CM | POA: Insufficient documentation

## 2015-07-24 DIAGNOSIS — M47812 Spondylosis without myelopathy or radiculopathy, cervical region: Secondary | ICD-10-CM | POA: Insufficient documentation

## 2015-07-24 DIAGNOSIS — Z79899 Other long term (current) drug therapy: Secondary | ICD-10-CM | POA: Insufficient documentation

## 2015-07-24 DIAGNOSIS — M797 Fibromyalgia: Secondary | ICD-10-CM | POA: Insufficient documentation

## 2015-07-24 DIAGNOSIS — Z5181 Encounter for therapeutic drug level monitoring: Secondary | ICD-10-CM | POA: Insufficient documentation

## 2015-07-24 DIAGNOSIS — M7521 Bicipital tendinitis, right shoulder: Secondary | ICD-10-CM | POA: Insufficient documentation

## 2015-07-24 DIAGNOSIS — M722 Plantar fascial fibromatosis: Secondary | ICD-10-CM | POA: Insufficient documentation

## 2015-07-28 ENCOUNTER — Encounter (HOSPITAL_BASED_OUTPATIENT_CLINIC_OR_DEPARTMENT_OTHER): Payer: BLUE CROSS/BLUE SHIELD | Admitting: Registered Nurse

## 2015-07-28 ENCOUNTER — Encounter: Payer: Self-pay | Admitting: Registered Nurse

## 2015-07-28 VITALS — BP 124/82 | HR 66 | Resp 16

## 2015-07-28 DIAGNOSIS — Z79899 Other long term (current) drug therapy: Secondary | ICD-10-CM | POA: Diagnosis present

## 2015-07-28 DIAGNOSIS — M7521 Bicipital tendinitis, right shoulder: Secondary | ICD-10-CM | POA: Diagnosis present

## 2015-07-28 DIAGNOSIS — M25512 Pain in left shoulder: Secondary | ICD-10-CM

## 2015-07-28 DIAGNOSIS — M722 Plantar fascial fibromatosis: Secondary | ICD-10-CM | POA: Diagnosis present

## 2015-07-28 DIAGNOSIS — G4700 Insomnia, unspecified: Secondary | ICD-10-CM

## 2015-07-28 DIAGNOSIS — M797 Fibromyalgia: Secondary | ICD-10-CM

## 2015-07-28 DIAGNOSIS — M47812 Spondylosis without myelopathy or radiculopathy, cervical region: Secondary | ICD-10-CM | POA: Diagnosis present

## 2015-07-28 DIAGNOSIS — G894 Chronic pain syndrome: Secondary | ICD-10-CM

## 2015-07-28 DIAGNOSIS — Z5181 Encounter for therapeutic drug level monitoring: Secondary | ICD-10-CM | POA: Diagnosis present

## 2015-07-28 DIAGNOSIS — M7918 Myalgia, other site: Secondary | ICD-10-CM

## 2015-07-28 MED ORDER — FENTANYL 25 MCG/HR TD PT72: 25.0000 ug | MEDICATED_PATCH | TRANSDERMAL | Status: DC

## 2015-07-28 MED ORDER — TRAZODONE HCL 50 MG PO TABS: 50.0000 mg | ORAL_TABLET | Freq: Every day | ORAL | Status: DC

## 2015-07-28 MED ORDER — HYDROCODONE-ACETAMINOPHEN 5-325 MG PO TABS: 1.0000 | ORAL_TABLET | Freq: Three times a day (TID) | ORAL | Status: DC | PRN

## 2015-07-28 NOTE — Progress Notes (Signed)
Subjective:    Patient ID: Stephenson Cichy, male    DOB: July 09, 1971, 44 y.o.   MRN: [id number]  HPI: Mr. Malcolm Quast is a 44 year old male who returns for follow up for chronic pain and medication refill. He says his pain is located in his neck and left shoulder. He rates his pain 7. His current exercise regime is walking.  Pain Inventory Average Pain 6 Pain Right Now 7 My pain is sharp, dull, stabbing, tingling and aching  In the last 24 hours, has pain interfered with the following? General activity 6 Relation with others 7 Enjoyment of life 8 What TIME of day is your pain at its worst? morning, night  Sleep (in general) Poor  Pain is worse with: walking, bending, inactivity and some activites Pain improves with: rest, heat/ice, medication and injections Relief from Meds: 5  Mobility walk without assistance Do you have any goals in this area?  no  Function employed # of hrs/week 48-70 Do you have any goals in this area?  no  Neuro/Psych No problems in this area  Prior Studies Any changes since last visit?  no  Physicians involved in your care Any changes since last visit?  no   Family History  Problem Relation Age of Onset  . Diabetes Mother   . Heart disease Mother   . Hyperlipidemia Mother   . Hypertension Mother    Social History   Social History  . Marital Status: Single    Spouse Name: N/A  . Number of Children: 0  . Years of Education: 14   Occupational History  .      [employer]   Social History Main Topics  . Smoking status: Never Smoker   . Smokeless tobacco: Current User    Types: Chew     Comment: tobacco  . Alcohol Use: No  . Drug Use: No  . Sexual Activity: Not Asked   Other Topics Concern  . None   Social History Narrative   Patient consumes 4 cups of caffeine and is right handed   Past Surgical History  Procedure Laterality Date  . Foot surgery      Left 8/12 right 10/12  . Wisdom tooth extraction    . Neck  surgery     Past Medical History  Diagnosis Date  . Hyperlipidemia   . Depression   . Plantar fasciitis   . Migraine   . Anxiety   . Cervical disc disorder    BP 124/82 mmHg  Pulse 66  Resp 16  SpO2 99%  Opioid Risk Score:   Fall Risk Score:  `1  Depression screen PHQ 2/9  Depression screen [facility] 2/9 06/29/2015 01/30/2015  Decreased Interest 1 1  Down, Depressed, Hopeless 0 1  PHQ - 2 Score 1 2  Altered sleeping - 2  Tired, decreased energy - 2  Change in appetite - 0  Feeling bad or failure about yourself  - 0  Trouble concentrating - 0  Moving slowly or fidgety/restless - 0  PHQ-9 Score - 6     Review of Systems  Constitutional: Positive for diaphoresis and appetite change.  All other systems reviewed and are negative.      Objective:   Physical Exam  Constitutional: He is oriented to person, place, and time. He appears well-developed and well-nourished.  HENT:  Head: Normocephalic and atraumatic.  Neck: Normal range of motion. Neck supple.  Cervical Paraspinal Tenderness: C-3- C-5  Cardiovascular: Normal rate and  regular rhythm.   Pulmonary/Chest: Effort normal and breath sounds normal.  Musculoskeletal:  Normal Muscle Bulk and Muscle Testing Reveals: Upper Extremities: Full ROM and Muscle Strength 5/5 Thoracic Paraspinal Tenderness: T-2- T-4 Mainly Right Side Lower Extremities: Full ROM and Muscle Strength 5/5 Arises from chair with ease Narrow Based Gait   Neurological: He is alert and oriented to person, place, and time.  Skin: Skin is warm and dry.  Psychiatric: He has a normal mood and affect.  Nursing note and vitals reviewed.         Assessment & Plan:  1. Cervical Spondylosis: Refilled: Fentanyl Patches one patch every other day. #15 and HYDROcodone 5/325mg  one tablet three times a day as needed #90. Continue with heat and ice therapy. 2. Cervical Radiculopathy: Continue Gabapentin.  3.Lumbago: No complaints today: Continue current  medication regime, using Voltaren gel 4.Muscle Spasms: Continued Flexeril 5. Depression: Continue Wellbutrin  20 minutes of face to face patient care was spent during this visit. All questions were encouraged and answered.   F/U in 1 month

## 2015-08-10 NOTE — Telephone Encounter (Signed)
DONE

## 2015-08-21 ENCOUNTER — Ambulatory Visit: Payer: BLUE CROSS/BLUE SHIELD | Admitting: Registered Nurse

## 2015-08-21 ENCOUNTER — Encounter: Payer: BLUE CROSS/BLUE SHIELD | Attending: Physical Medicine & Rehabilitation | Admitting: Registered Nurse

## 2015-08-21 DIAGNOSIS — M47812 Spondylosis without myelopathy or radiculopathy, cervical region: Secondary | ICD-10-CM | POA: Insufficient documentation

## 2015-08-21 DIAGNOSIS — Z5181 Encounter for therapeutic drug level monitoring: Secondary | ICD-10-CM | POA: Insufficient documentation

## 2015-08-21 DIAGNOSIS — Z79899 Other long term (current) drug therapy: Secondary | ICD-10-CM | POA: Insufficient documentation

## 2015-08-21 DIAGNOSIS — M7521 Bicipital tendinitis, right shoulder: Secondary | ICD-10-CM | POA: Insufficient documentation

## 2015-08-21 DIAGNOSIS — G894 Chronic pain syndrome: Secondary | ICD-10-CM | POA: Insufficient documentation

## 2015-08-21 DIAGNOSIS — M722 Plantar fascial fibromatosis: Secondary | ICD-10-CM | POA: Insufficient documentation

## 2015-08-21 DIAGNOSIS — M797 Fibromyalgia: Secondary | ICD-10-CM | POA: Insufficient documentation

## 2015-08-23 ENCOUNTER — Encounter: Payer: Self-pay | Admitting: Registered Nurse

## 2015-08-23 ENCOUNTER — Encounter (HOSPITAL_BASED_OUTPATIENT_CLINIC_OR_DEPARTMENT_OTHER): Payer: BLUE CROSS/BLUE SHIELD | Admitting: Registered Nurse

## 2015-08-23 VITALS — BP 150/95 | HR 78

## 2015-08-23 DIAGNOSIS — M47812 Spondylosis without myelopathy or radiculopathy, cervical region: Secondary | ICD-10-CM

## 2015-08-23 DIAGNOSIS — Z79899 Other long term (current) drug therapy: Secondary | ICD-10-CM

## 2015-08-23 DIAGNOSIS — G894 Chronic pain syndrome: Secondary | ICD-10-CM

## 2015-08-23 DIAGNOSIS — M7918 Myalgia, other site: Secondary | ICD-10-CM

## 2015-08-23 DIAGNOSIS — Z5181 Encounter for therapeutic drug level monitoring: Secondary | ICD-10-CM | POA: Diagnosis not present

## 2015-08-23 DIAGNOSIS — M797 Fibromyalgia: Secondary | ICD-10-CM

## 2015-08-23 MED ORDER — FENTANYL 25 MCG/HR TD PT72: 25.0000 ug | MEDICATED_PATCH | TRANSDERMAL | Status: DC

## 2015-08-23 MED ORDER — HYDROCODONE-ACETAMINOPHEN 5-325 MG PO TABS: 1.0000 | ORAL_TABLET | Freq: Three times a day (TID) | ORAL | Status: DC | PRN

## 2015-08-23 MED ORDER — METHYLPREDNISOLONE 4 MG PO TBPK: ORAL_TABLET | ORAL | Status: DC

## 2015-08-23 NOTE — Progress Notes (Signed)
Subjective:    Patient ID: Alexander Douglas, male    DOB: 1971-09-29, 44 y.o.   MRN: [id number]  HPI: [name] is a 44 year old male who returns for follow up for chronic pain and medication refill. He says his pain is located in his neck and left hand. He rates his pain 7. His current exercise regime is walking.  Pain Inventory Average Pain 5 Pain Right Now 7 My pain is sharp, dull, stabbing, tingling and aching  In the last 24 hours, has pain interfered with the following? General activity 5 Relation with others 6 Enjoyment of life 7 What TIME of day is your pain at its worst? Morning and Night Sleep (in general) Poor  Pain is worse with: walking, bending, inactivity and some activites Pain improves with: rest, heat/ice, pacing activities and medication Relief from Meds: 6  Mobility transfers alone  Function employed # of hrs/week 40  Neuro/Psych numbness anxiety  Prior Studies Any changes since last visit?  no  Physicians involved in your care Any changes since last visit?  no   Family History  Problem Relation Age of Onset  . Diabetes Mother   . Heart disease Mother   . Hyperlipidemia Mother   . Hypertension Mother    Social History   Social History  . Marital Status: Single    Spouse Name: N/A  . Number of Children: 0  . Years of Education: 14   Occupational History  .      [employer]   Social History Main Topics  . Smoking status: Never Smoker   . Smokeless tobacco: Current User    Types: Chew     Comment: tobacco  . Alcohol Use: No  . Drug Use: No  . Sexual Activity: Not Asked   Other Topics Concern  . None   Social History Narrative   Patient consumes 4 cups of caffeine and is right handed   Past Surgical History  Procedure Laterality Date  . Foot surgery      Left 8/12 right 10/12  . Wisdom tooth extraction    . Neck surgery     Past Medical History  Diagnosis Date  . Hyperlipidemia   . Depression   .  Plantar fasciitis   . Migraine   . Anxiety   . Cervical disc disorder    BP 150/95 mmHg  Pulse 78  SpO2 98%  Opioid Risk Score:   Fall Risk Score:  `1  Depression screen PHQ 2/9  Depression screen [facility] 2/9 06/29/2015 01/30/2015  Decreased Interest 1 1  Down, Depressed, Hopeless 0 1  PHQ - 2 Score 1 2  Altered sleeping - 2  Tired, decreased energy - 2  Change in appetite - 0  Feeling bad or failure about yourself  - 0  Trouble concentrating - 0  Moving slowly or fidgety/restless - 0  PHQ-9 Score - 6    Review of Systems  Neurological: Positive for numbness.  All other systems reviewed and are negative.      Objective:   Physical Exam  Constitutional: He is oriented to person, place, and time. He appears well-developed and well-nourished.  HENT:  Head: Normocephalic and atraumatic.  Neck: Normal range of motion. Neck supple.  Cervical Paraspinal Tenderness: C-5- C-6   Cardiovascular: Normal rate and regular rhythm.   Pulmonary/Chest: Effort normal and breath sounds normal.  Musculoskeletal:  Normal Muscle Bulk and Muscle Testing Reveals: Upper Extremities: Full ROM and Muscle Strength 5/5  Lower Extremities: Full ROM and Muscle Strength 5/5 Arises from chair with ease Narrow Based Gait  Neurological: He is alert and oriented to person, place, and time.  Skin: Skin is warm and dry.  Psychiatric: He has a normal mood and affect.  Nursing note and vitals reviewed.         Assessment & Plan:  1. Cervical Spondylosis: Refilled: Fentanyl Patches one patch every other day. #15 and HYDROcodone 5/325mg  one tablet three times a day as needed #90. Continue with heat and ice therapy. RX: Medrol Dose Pak 2. Cervical Radiculopathy: Continue Gabapentin.  3.Lumbago: No complaints today: Continue current medication regime, using Voltaren gel 4.Muscle Spasms: Continued Flexeril 5. Depression: Continue Wellbutrin  20 minutes of face to face patient care was spent during  this visit. All questions were encouraged and answered.   F/U in 1 month

## 2015-09-11 ENCOUNTER — Other Ambulatory Visit: Payer: Self-pay | Admitting: Medical

## 2015-09-12 NOTE — Telephone Encounter (Signed)
Is this ok to refill?  

## 2015-09-19 ENCOUNTER — Other Ambulatory Visit: Payer: Self-pay | Admitting: Medical

## 2015-09-19 NOTE — Telephone Encounter (Signed)
Is this okay?

## 2015-09-20 ENCOUNTER — Encounter: Payer: Self-pay | Admitting: Physical Medicine & Rehabilitation

## 2015-09-20 ENCOUNTER — Encounter
Payer: BLUE CROSS/BLUE SHIELD | Attending: Physical Medicine & Rehabilitation | Admitting: Physical Medicine & Rehabilitation

## 2015-09-20 ENCOUNTER — Other Ambulatory Visit: Payer: Self-pay | Admitting: Physical Medicine & Rehabilitation

## 2015-09-20 VITALS — BP 160/105 | HR 80

## 2015-09-20 DIAGNOSIS — Z79899 Other long term (current) drug therapy: Secondary | ICD-10-CM | POA: Diagnosis present

## 2015-09-20 DIAGNOSIS — Z5181 Encounter for therapeutic drug level monitoring: Secondary | ICD-10-CM | POA: Diagnosis present

## 2015-09-20 DIAGNOSIS — M7521 Bicipital tendinitis, right shoulder: Secondary | ICD-10-CM | POA: Diagnosis present

## 2015-09-20 DIAGNOSIS — M7918 Myalgia, other site: Secondary | ICD-10-CM

## 2015-09-20 DIAGNOSIS — M797 Fibromyalgia: Secondary | ICD-10-CM | POA: Insufficient documentation

## 2015-09-20 DIAGNOSIS — M654 Radial styloid tenosynovitis [de Quervain]: Secondary | ICD-10-CM

## 2015-09-20 DIAGNOSIS — M722 Plantar fascial fibromatosis: Secondary | ICD-10-CM | POA: Diagnosis not present

## 2015-09-20 DIAGNOSIS — M7522 Bicipital tendinitis, left shoulder: Secondary | ICD-10-CM

## 2015-09-20 DIAGNOSIS — M47812 Spondylosis without myelopathy or radiculopathy, cervical region: Secondary | ICD-10-CM | POA: Diagnosis not present

## 2015-09-20 DIAGNOSIS — G894 Chronic pain syndrome: Secondary | ICD-10-CM

## 2015-09-20 DIAGNOSIS — M1288 Other specific arthropathies, not elsewhere classified, other specified site: Secondary | ICD-10-CM | POA: Diagnosis not present

## 2015-09-20 MED ORDER — HYDROCODONE-ACETAMINOPHEN 5-325 MG PO TABS: 1.0000 | ORAL_TABLET | Freq: Three times a day (TID) | ORAL | Status: DC | PRN

## 2015-09-20 MED ORDER — FENTANYL 25 MCG/HR TD PT72: 25.0000 ug | MEDICATED_PATCH | TRANSDERMAL | Status: DC

## 2015-09-20 NOTE — Patient Instructions (Signed)
Biceps Tendon Tendinitis (Proximal) and Tenosynovitis With Rehab Tendonitis and tenosynovitis involve inflammation of the tendon and the tendon lining (sheath). The proximal biceps tendon is vulnerable to tendonitis and tenosynovitis, which causes pain and discomfort in the front of the shoulder and upper arm. The tendon lining secretes a fluid that helps lubricate the tendon, allowing for proper function without pain. When the tendon and its lining become inflamed, the tendon can no longer glide smoothly, causing pain. The proximal biceps tendon connects the biceps muscle to two bones of the shoulder. It is important for proper function of the elbow and turning the palm upward (supination) using the wrist. Proximal biceps tendon tendinitis may include a grade 1 or 2 strain of the tendon. Grade 1 strains involve a slight pull of the tendon without signs of tearing and no observed tendon lengthening. There is also no loss of strength. Grade 2 strains involve small tears in the tendon fibers. The tendon or muscle is stretched and strength is usually decreased.  SYMPTOMS   Pain, tenderness, swelling, warmth, or redness over the front of the shoulder.  Pain that gets worse with shoulder and elbow use, especially against resistance.  Limited motion of the shoulder or elbow.  Crackling sound (crepitation) when the tendon or shoulder is moved or touched. CAUSES  The symptoms of biceps tendonitis are due to inflammation of the tendon. Inflammation may be caused by:  Strain from sudden increase in amount or intensity of activity.  Direct blow or injury to the elbow (uncommon).  Overuse or repetitive elbow bending or wrist rotation, particularly when turning the palm up, or with elbow hyperextension. RISK INCREASES WITH:  Sports that involve contact or overhead arm activity (throwing sports, gymnastics, weightlifting, bodybuilding, rock climbing).  Heavy labor.  Poor strength and  flexibility.  Failure to warm up properly before activity. PREVENTION  Warm up and stretch properly before activity.  Allow time for recovery between activities.  Maintain physical fitness:  Strength, flexibility, and endurance.  Cardiovascular fitness.  Learn and use proper exercise technique. PROGNOSIS  With proper treatment, proximal biceps tendon tendonitis and tenosynovitis is usually curable within 6 weeks. Healing is usually quicker if the cause was a direct blow, not overuse.  RELATED COMPLICATIONS   Longer healing time if not properly treated or if not given enough time to heal.  Chronically inflamed tendon that causes persistent pain with activity, that may progress to constant pain and potentially rupture of the tendon.  Recurring symptoms, especially if activity is resumed too soon or with overuse, a direct blow, or use of poor exercise technique. TREATMENT Treatment first involves ice and medicine, to reduce pain and inflammation. It is helpful to modify activities that cause pain, to reduce the chances of causing the condition to get worse. Strengthening and stretching exercises should be performed to promote proper use of the muscles of the shoulder. These exercises may be performed at home or with a therapist. Other treatments may be given such as ultrasound or heat therapy. A corticosteroid injection may be recommended to help reduce inflammation of the tendon lining. Surgery is usually not necessary. Sometimes, if symptoms last for greater than 6 months, surgery will be advised to detach the tendon and re-insert it into the arm bone. Surgery to correct other shoulder problems that may be contributing to tendinitis may be advised before surgery for the tendinitis itself.  MEDICATION  If pain medicine is needed, nonsteroidal anti-inflammatory medicines (aspirin and ibuprofen), or other minor pain relievers (  acetaminophen), are often advised.  Do not take pain medicine  for 7 days before surgery.  Prescription pain relievers may be given if your caregiver thinks they are needed. Use only as directed and only as much as you need.  Corticosteroid injections may be given. These injections should only be used on the most severe cases, as one can only receive a limited number of them. HEAT AND COLD   Cold treatment (icing) should be applied for 10 to 15 minutes every 2 to 3 hours for inflammation and pain, and immediately after activity that aggravates your symptoms. Use ice packs or an ice massage.  Heat treatment may be used before performing stretching and strengthening activities prescribed by your caregiver, physical therapist, or athletic trainer. Use a heat pack or a warm water soak. SEEK MEDICAL CARE IF:   Symptoms get worse or do not improve in 2 weeks, despite treatment.  New, unexplained symptoms develop. (Drugs used in treatment may produce side effects.) EXERCISES RANGE OF MOTION (ROM) AND EXERCISES - Biceps Tendon (Proximal) and Tenosynovitis These exercises may help you when beginning to rehabilitate your injury. Your symptoms may go away with or without further involvement from your physician, physical therapist, or athletic trainer. While completing these exercises, remember:   Restoring tissue flexibility helps normal motion to return to the joints. This allows healthier, less painful movement and activity.  An effective stretch should be held for at least 30 seconds.  A stretch should never be painful. You should only feel a gentle lengthening or release in the stretched tissue. STRETCH - Flexion, Standing  Stand with good posture. With an underhand grip on your right / left hand and an overhand grip on the opposite hand, grasp a broomstick or cane so that your hands are a little more than shoulder width apart.  Keeping your right / left elbow straight and shoulder muscles relaxed, push the stick with your opposite hand to raise your right  / left arm in front of your body and then overhead. Raise your arm until you feel a stretch in your right / left shoulder, but before you have increased shoulder pain.  Try to avoid shrugging your right / left shoulder as your arm rises, by keeping your shoulder blade tucked down and toward your mid-back spine. Hold for __________ seconds.  Slowly return to the starting position. Repeat __________ times. Complete this exercise __________ times per day. STRETCH - Abduction, Supine  Lie on your back. With an underhand grip on your right / left hand and an overhand grip on the opposite hand, grasp a broomstick or cane so that your hands are a little more than shoulder width apart.  Keeping your right / left elbow straight and shoulder muscles relaxed, push the stick with your opposite hand to raise your right / left arm out to the side of your body and then overhead. Raise your arm until you feel a stretch in your right / left shoulder, but before you have increased shoulder pain.  Try to avoid shrugging your right / left shoulder as your arm rises, by keeping your shoulder blade tucked down and toward your mid-back spine. Hold for __________ seconds.  Slowly return to the starting position. Repeat __________ times. Complete this exercise __________ times per day. ROM - Flexion, Active-Assisted  Lie on your back. You may bend your knees for comfort.  Grasp a broomstick or cane so your hands are about shoulder width apart. Your right / left hand should   grip the end of the stick so that your hand is positioned "thumbs-up," as if you were about to shake hands.  Using your healthy arm to lead, raise your right / left arm overhead until you feel a gentle stretch in your shoulder. Hold for __________ seconds.  Use the stick to assist in returning your right / left arm to its starting position. Repeat __________ times. Complete this exercise __________ times per day.  STRETCH - Flexion, Standing    Stand facing a wall. Walk your right / left fingers up the wall until you feel a moderate stretch in your shoulder. As your hand gets higher, you may need to step closer to the wall or use a door frame to walk through.  Try to avoid shrugging your right / left shoulder as your arm rises, by keeping your shoulder blade tucked down and toward your mid-back spine.  Hold for __________ seconds. Use your other hand, if needed, to ease out of the stretch and return to the starting position. Repeat __________ times. Complete this exercise __________ times per day.  ROM - Internal Rotation   Using underhand grips, grasp a stick behind your back with both hands.  While standing upright with good posture, slide the stick up your back until you feel a mild stretch in the front of your shoulder.  Hold for __________ seconds. Slowly return to your starting position. Repeat __________ times. Complete this exercise __________ times per day.  STRETCH - Internal Rotation  Place your right / left hand behind your back, palm-up.  Throw a towel or belt over your opposite shoulder. Grasp the towel with your right / left hand.  While keeping an upright posture, gently pull up on the towel until you feel a stretch in the front of your right / left shoulder.  Avoid shrugging your right / left shoulder as your arm rises, by keeping your shoulder blade tucked down and toward your mid-back spine.  Hold for __________ seconds. Release the stretch by lowering your opposite hand. Repeat __________ times. Complete this exercise __________ times per day. STRENGTHENING EXERCISES - Biceps Tendon Tendinitis (Proximal) and Tenosynovitis These exercises may help you regain your strength after your physician has discontinued your restraint in a cast or brace. They may resolve your symptoms with or without further involvement from your physician, physical therapist or athletic trainer. While completing these exercises,  remember:   Muscles can gain both the endurance and the strength needed for everyday activities through controlled exercises.  Complete these exercises as instructed by your physician, physical therapist or athletic trainer. Increase the resistance and repetitions only as guided.  You may experience muscle soreness or fatigue, but the pain or discomfort you are trying to eliminate should never worsen during these exercises. If this pain does get worse, stop and make sure you are following the directions exactly. If the pain is still present after adjustments, discontinue the exercise until you can discuss the trouble with your caregiver. STRENGTH - Elbow Flexors, Isometric  Stand or sit upright on a firm surface. Place your right / left arm so that your hand is palm-up and at the height of your waist.  Place your opposite hand on top of your forearm. Gently push down as your right / left arm resists. Push as hard as you can with both arms, without causing any pain or movement at your right / left elbow. Hold this stationary position for __________ seconds.  Gradually release the tension in both   arms. Allow your muscles to relax completely before repeating. Repeat __________ times. Complete this exercise __________ times per day. STRENGTH - Shoulder Flexion, Isometric  With good posture and facing a wall, stand or sit about 4-6 inches away.  Keeping your right / left elbow straight, gently press the top of your fist into the wall. Increase the pressure gradually until you are pressing as hard as you can, without shrugging your shoulder or increasing any shoulder discomfort.  Hold for __________ seconds.  Release the tension slowly. Relax your shoulder muscles completely before you start the next repetition. Repeat __________ times. Complete this exercise __________ times per day.  STRENGTH - Elbow Flexors, Supinated  With good posture, stand or sit on a firm chair without armrests. Allow  your right / left arm to rest at your side with your palm facing forward.  Holding a __________ weight, or gripping a rubber exercise band or tubing,  bring your hand toward your shoulder.  Allow your muscles to control the resistance as your hand returns to your side. Repeat __________ times. Complete this exercise __________ times per day.  STRENGTH - Shoulder Flexion  Stand or sit with good posture. Grasp a __________ weight, or an exercise band or tubing, so that your hand is "thumbs-up," like when you shake hands.  Slowly lift your right / left arm as far as you can, without increasing any shoulder pain. At first, many people can only raise their hand to shoulder height.  Avoid shrugging your right / left shoulder as your arm rises, by keeping your shoulder blade tucked down and toward your mid-back spine.  Hold for __________ seconds. Control the descent of your hand as you slowly return to your starting position. Repeat __________ times. Complete this exercise __________ times per day.   This information is not intended to replace advice given to you by your health care provider. Make sure you discuss any questions you have with your health care provider.   Document Released: 10/28/2005 Document Revised: 11/18/2014 Document Reviewed: 02/09/2009 Elsevier Interactive Patient Education 2016 [employer]   

## 2015-09-20 NOTE — Progress Notes (Signed)
Subjective:    Patient ID: Alexander Douglas, male    DOB: 12-Sep-1971, 44 y.o.   MRN: [id number]  HPI   Alexander Douglas is back regarding his chronic pain. He states his stress levels have been higher and he's not been sleeping as well. His pain levels have been higher perhaps as a result.   He continues to work full time and the load is taking its toll on him. He is still expected to work at a high level and he [place] keeping up with the physcialitiy of it all.  His neck has been fair, but it bothers him when he's more active. He has experimented with different pillows for comfort at night. He uses heat sometimes.   His patch and hydrocodone still are effective. He does feel his pain increase prior to a patch change.       Pain Inventory Average Pain 6 Pain Right Now 7 My pain is sharp, dull, stabbing and aching  In the last 24 hours, has pain interfered with the following? General activity 6 Relation with others 7 Enjoyment of life 8 What TIME of day is your pain at its worst? Morning and Night Sleep (in general) NA  Pain is worse with: walking, bending, inactivity, standing and some activites Pain improves with: rest, heat/ice, therapy/exercise, medication and injections Relief from Meds: 5  Mobility Do you have any goals in this area?  no  Function Do you have any goals in this area?  no  Neuro/Psych tingling spasms anxiety  Prior Studies Any changes since last visit?  no  Physicians involved in your care Any changes since last visit?  no   Family History  Problem Relation Age of Onset  . Diabetes Mother   . Heart disease Mother   . Hyperlipidemia Mother   . Hypertension Mother    Social History   Social History  . Marital Status: Single    Spouse Name: N/A  . Number of Children: 0  . Years of Education: 14   Occupational History  .      [employer]   Social History Main Topics  . Smoking status: Never Smoker   . Smokeless tobacco: Current  User    Types: Chew     Comment: tobacco  . Alcohol Use: No  . Drug Use: No  . Sexual Activity: Not Asked   Other Topics Concern  . None   Social History Narrative   Patient consumes 4 cups of caffeine and is right handed   Past Surgical History  Procedure Laterality Date  . Foot surgery      Left 8/12 right 10/12  . Wisdom tooth extraction    . Neck surgery     Past Medical History  Diagnosis Date  . Hyperlipidemia   . Depression   . Plantar fasciitis   . Migraine   . Anxiety   . Cervical disc disorder    BP 160/105 mmHg  Pulse 80  SpO2 99%  Opioid Risk Score:   Fall Risk Score:  `1  Depression screen PHQ 2/9  Depression screen [facility] 2/9 06/29/2015 01/30/2015  Decreased Interest 1 1  Down, Depressed, Hopeless 0 1  PHQ - 2 Score 1 2  Altered sleeping - 2  Tired, decreased energy - 2  Change in appetite - 0  Feeling bad or failure about yourself  - 0  Trouble concentrating - 0  Moving slowly or fidgety/restless - 0  PHQ-9 Score - 6  Review of Systems  Musculoskeletal:       Spasms  Neurological:       Tingling  Psychiatric/Behavioral: The patient is nervous/anxious.   All other systems reviewed and are negative.      Objective:   Physical Exam  General: Alert and oriented x 3, No apparent distress  HEENT: Head is normocephalic, atraumatic, PERRLA, EOMI, sclera anicteric, oral mucosa pink and moist, dentition intact, ext ear canals clear,  Neck: Supple without JVD or lymphadenopathy  Heart: Reg rate and rhythm. No murmurs rubs or gallops  Chest: CTA bilaterally without wheezes, rales, or rhonchi; no distress  Abdomen: Soft, non-tender, non-distended, bowel sounds positive.  Extremities: No clubbing, cyanosis, or edema. Pulses are 2+  Skin: Clean and intact without signs of breakdown  Neuro: Pt is cognitively appropriate with normal insight, memory, and awareness. Cranial nerves 2-12 are intact. Sensory exam is normal. Reflexes are 2+ in all 4's.  Fine motor coordination is intact. No tremors. Motor function is grossly 5/5.  Musculoskeletal:  neck pain with extension. Facet maneuvers positive. Both medial arches are tender with scarring noted along the flexor tendons and arch. Gait slightly antalgic. He has tendernes with palpation of the left long head biceps tendon---RTC maneuvers are equivocal. Posture is fair. Scapulae are tight. Head remains forward with shoulders curved. Psych: Pt's affect is appropriate. Pt is cooperative     Assessment & Plan:   1. Chronic plantar fasciitis with plantar fibroids  2. Right biceps tendonitis, labral injury. Has left biceps tendonitis as well now 3. Cervical spondylosis, ?facet arthopathy  4. Shift sleep disorder  5. De Quervain's Tenosynovitis: Left hand   Plan:  1. Continue voltaren gel to both feet, right shoulder and left shoulder  2. Can pursue RF's at a later point (C4-5, C5-6) when he's ready from a pain/financial standpoint.  3. Continue fentanyl to 11mcg q48 hours and hydrocodone. rxes were refilled today.   4. Reviewed left biceps tendon mgt---exercises were provided. Consider injection if pain resistant. Reviewed scapular ROM exercises  5. Continue with zanaflex before sleep.  6. Naproxen 2 tabs BID prn with food.  7. Follow up with NP in about a month. 15 minutes of face to face patient care time were spent during this visit. All questions were encouraged and answered. Discussed the fact that he needs to be smart about his work load, pacing, posture, techniques. Needs to look at alternatives as they pertain to his vocation. He has an interest in IT---encouraged him to look into what training would be required for him.

## 2015-09-21 LAB — PMP ALCOHOL METABOLITE (ETG): Ethyl Glucuronide (EtG): NEGATIVE ng/mL

## 2015-09-24 LAB — OPIATES/OPIOIDS (LC/MS-MS)
Codeine Urine: NEGATIVE ng/mL (ref <50)
HYDROMORPHONE: NEGATIVE ng/mL — AB (ref <50)
Hydrocodone: 387 ng/mL (ref <50)
MORPHINE: NEGATIVE ng/mL (ref <50)
NORHYDROCODONE, UR: 669 ng/mL (ref <50)
Noroxycodone, Ur: NEGATIVE ng/mL (ref <50)
OXYCODONE, UR: NEGATIVE ng/mL (ref <50)
OXYMORPHONE, URINE: NEGATIVE ng/mL (ref <50)

## 2015-09-24 LAB — BENZODIAZEPINES (GC/LC/MS), URINE
ALPRAZOLAMU: 275 ng/mL (ref <25)
Clonazepam metabolite (GC/LC/MS), ur confirm: NEGATIVE ng/mL (ref <25)
FLURAZEPAMU: NEGATIVE ng/mL (ref <50)
LORAZEPAMU: NEGATIVE ng/mL (ref <50)
MIDAZOLAMU: NEGATIVE ng/mL (ref <50)
Nordiazepam (GC/LC/MS), ur confirm: NEGATIVE ng/mL (ref <50)
Oxazepam (GC/LC/MS), ur confirm: NEGATIVE ng/mL (ref <50)
TRIAZOLAMU: NEGATIVE ng/mL (ref <50)
Temazepam (GC/LC/MS), ur confirm: NEGATIVE ng/mL (ref <50)

## 2015-09-24 LAB — FENTANYL (GC/LC/MS), URINE
FENTANYL (GC/MS) CONFIRM: 7.1 ng/mL (ref <0.5)
Norfentanyl, confirm: 279.6 ng/mL (ref <0.5)

## 2015-09-26 LAB — PRESCRIPTION MONITORING PROFILE (SOLSTAS)
AMPHETAMINE/METH: NEGATIVE ng/mL
Barbiturate Screen, Urine: NEGATIVE ng/mL
Buprenorphine, Urine: NEGATIVE ng/mL
Cannabinoid Scrn, Ur: NEGATIVE ng/mL
Carisoprodol, Urine: NEGATIVE ng/mL
Cocaine Metabolites: NEGATIVE ng/mL
Creatinine, Urine: 85.5 mg/dL (ref >20.0)
MDMA URINE: NEGATIVE ng/mL
METHADONE SCREEN, URINE: NEGATIVE ng/mL
Meperidine, Ur: NEGATIVE ng/mL
NITRITES URINE, INITIAL: NEGATIVE ug/mL
OXYCODONE SCRN UR: NEGATIVE ng/mL
PROPOXYPHENE: NEGATIVE ng/mL
TAPENTADOLUR: NEGATIVE ng/mL
TRAMADOL UR: NEGATIVE ng/mL
Zolpidem, Urine: NEGATIVE ng/mL
pH, Initial: 7 pH (ref 4.5–8.9)

## 2015-09-28 NOTE — Progress Notes (Signed)
Urine drug screen for this encounter is consistent for prescribed medication 

## 2015-10-18 ENCOUNTER — Encounter: Payer: Self-pay | Admitting: Registered Nurse

## 2015-10-18 ENCOUNTER — Encounter: Payer: BC Managed Care – PPO | Attending: Physical Medicine & Rehabilitation | Admitting: Registered Nurse

## 2015-10-18 VITALS — BP 149/90 | HR 71 | Resp 14

## 2015-10-18 DIAGNOSIS — M7918 Myalgia, other site: Secondary | ICD-10-CM

## 2015-10-18 DIAGNOSIS — M797 Fibromyalgia: Secondary | ICD-10-CM | POA: Diagnosis not present

## 2015-10-18 DIAGNOSIS — Z79899 Other long term (current) drug therapy: Secondary | ICD-10-CM

## 2015-10-18 DIAGNOSIS — M1288 Other specific arthropathies, not elsewhere classified, other specified site: Secondary | ICD-10-CM

## 2015-10-18 DIAGNOSIS — G894 Chronic pain syndrome: Secondary | ICD-10-CM | POA: Diagnosis not present

## 2015-10-18 DIAGNOSIS — M722 Plantar fascial fibromatosis: Secondary | ICD-10-CM | POA: Diagnosis present

## 2015-10-18 DIAGNOSIS — M7521 Bicipital tendinitis, right shoulder: Secondary | ICD-10-CM | POA: Diagnosis present

## 2015-10-18 DIAGNOSIS — M47812 Spondylosis without myelopathy or radiculopathy, cervical region: Secondary | ICD-10-CM

## 2015-10-18 DIAGNOSIS — Z5181 Encounter for therapeutic drug level monitoring: Secondary | ICD-10-CM | POA: Insufficient documentation

## 2015-10-18 MED ORDER — HYDROCODONE-ACETAMINOPHEN 5-325 MG PO TABS: 1.0000 | ORAL_TABLET | Freq: Three times a day (TID) | ORAL | Status: DC | PRN

## 2015-10-18 MED ORDER — FENTANYL 25 MCG/HR TD PT72: 25.0000 ug | MEDICATED_PATCH | TRANSDERMAL | Status: DC

## 2015-10-18 NOTE — Progress Notes (Signed)
Subjective:    Patient ID: Alexander Douglas, male    DOB: [birth year], 44 y.o.   MRN: [id number]  HPI: [name] is a 44 year old male who returns for follow up for chronic pain and medication refill. He says his pain is located in his neck and left shoulder. He rates his pain 6. His current exercise regime is walking and performing stretching exercises.  Pain Inventory Average Pain 5 Pain Right Now 6 My pain is sharp, dull, tingling and aching  In the last 24 hours, has pain interfered with the following? General activity 6 Relation with others 7 Enjoyment of life 8 What TIME of day is your pain at its worst? morning , night Sleep (in general) Poor  Pain is worse with: walking, bending, inactivity, standing and some activites Pain improves with: rest, heat/ice and medication Relief from Meds: 5  Mobility Do you have any goals in this area?  no  Function Do you have any goals in this area?  no  Neuro/Psych No problems in this area  Prior Studies Any changes since last visit?  no  Physicians involved in your care Any changes since last visit?  no   Family History  Problem Relation Age of Onset  . Diabetes Mother   . Heart disease Mother   . Hyperlipidemia Mother   . Hypertension Mother    Social History   Social History  . Marital Status: Single    Spouse Name: N/A  . Number of Children: 0  . Years of Education: 14   Occupational History  .      [employer]   Social History Main Topics  . Smoking status: Never Smoker   . Smokeless tobacco: Current User    Types: Chew     Comment: tobacco  . Alcohol Use: No  . Drug Use: No  . Sexual Activity: Not Asked   Other Topics Concern  . None   Social History Narrative   Patient consumes 4 cups of caffeine and is right handed   Past Surgical History  Procedure Laterality Date  . Foot surgery      Left 8/12 right 10/12  . Wisdom tooth extraction    . Neck surgery     Past Medical  History  Diagnosis Date  . Hyperlipidemia   . Depression   . Plantar fasciitis   . Migraine   . Anxiety   . Cervical disc disorder    BP 149/90 mmHg  Pulse 71  Resp 14  SpO2 99%  Opioid Risk Score:   Fall Risk Score:  `1  Depression screen PHQ 2/9  Depression screen [facility] 2/9 06/29/2015 01/30/2015  Decreased Interest 1 1  Down, Depressed, Hopeless 0 1  PHQ - 2 Score 1 2  Altered sleeping - 2  Tired, decreased energy - 2  Change in appetite - 0  Feeling bad or failure about yourself  - 0  Trouble concentrating - 0  Moving slowly or fidgety/restless - 0  PHQ-9 Score - 6     Review of Systems  All other systems reviewed and are negative.      Objective:   Physical Exam  Constitutional: He is oriented to person, place, and time. He appears well-developed and well-nourished.  HENT:  Head: Normocephalic and atraumatic.  Neck: Normal range of motion. Neck supple.  Cervical Paraspinal Tenderness: C-5- C-6   Cardiovascular: Normal rate and regular rhythm.   Pulmonary/Chest: Effort normal and breath sounds normal.  Musculoskeletal:  Normal Muscle Bulk and Muscle Testing Reveals: Upper Extremities: Full ROM and Muscle Strength 5/5 Lower Extremities: Full ROM and Muscle Strength 5/5 Arises from chair with ease Narrow Based Gait  Neurological: He is alert and oriented to person, place, and time.  Skin: Skin is warm and dry.  Psychiatric: He has a normal mood and affect.  Nursing note and vitals reviewed.         Assessment & Plan:  1. Cervical Spondylosis: Refilled: Fentanyl Patches one patch every other day. #15 and HYDROcodone 5/325mg  one tablet three times a day as needed #90. Continue with heat and ice therapy.  2. Cervical Radiculopathy: Continue Gabapentin.  3.Lumbago: No complaints today: Continue current medication regime, using Voltaren gel 4.Muscle Spasms: Continued Flexeril 5. Depression: Continue Wellbutrin  20 minutes of face to face patient care  was spent during this visit. All questions were encouraged and answered.   F/U in 1 month

## 2015-11-22 ENCOUNTER — Encounter: Payer: BLUE CROSS/BLUE SHIELD | Attending: Registered Nurse | Admitting: Registered Nurse

## 2015-11-22 ENCOUNTER — Encounter: Payer: Self-pay | Admitting: Registered Nurse

## 2015-11-22 VITALS — BP 146/92 | HR 74

## 2015-11-22 DIAGNOSIS — M47812 Spondylosis without myelopathy or radiculopathy, cervical region: Secondary | ICD-10-CM | POA: Diagnosis not present

## 2015-11-22 DIAGNOSIS — Z5181 Encounter for therapeutic drug level monitoring: Secondary | ICD-10-CM

## 2015-11-22 DIAGNOSIS — M722 Plantar fascial fibromatosis: Secondary | ICD-10-CM | POA: Insufficient documentation

## 2015-11-22 DIAGNOSIS — F17200 Nicotine dependence, unspecified, uncomplicated: Secondary | ICD-10-CM | POA: Insufficient documentation

## 2015-11-22 DIAGNOSIS — E785 Hyperlipidemia, unspecified: Secondary | ICD-10-CM | POA: Diagnosis not present

## 2015-11-22 DIAGNOSIS — M5080 Other cervical disc disorders, unspecified cervical region: Secondary | ICD-10-CM | POA: Diagnosis not present

## 2015-11-22 DIAGNOSIS — M1288 Other specific arthropathies, not elsewhere classified, other specified site: Secondary | ICD-10-CM | POA: Diagnosis not present

## 2015-11-22 DIAGNOSIS — F419 Anxiety disorder, unspecified: Secondary | ICD-10-CM | POA: Diagnosis not present

## 2015-11-22 DIAGNOSIS — Z8249 Family history of ischemic heart disease and other diseases of the circulatory system: Secondary | ICD-10-CM | POA: Diagnosis not present

## 2015-11-22 DIAGNOSIS — M797 Fibromyalgia: Secondary | ICD-10-CM | POA: Diagnosis not present

## 2015-11-22 DIAGNOSIS — G8929 Other chronic pain: Secondary | ICD-10-CM | POA: Diagnosis not present

## 2015-11-22 DIAGNOSIS — M5412 Radiculopathy, cervical region: Secondary | ICD-10-CM | POA: Diagnosis not present

## 2015-11-22 DIAGNOSIS — M545 Low back pain: Secondary | ICD-10-CM | POA: Diagnosis not present

## 2015-11-22 DIAGNOSIS — R202 Paresthesia of skin: Secondary | ICD-10-CM | POA: Diagnosis not present

## 2015-11-22 DIAGNOSIS — F329 Major depressive disorder, single episode, unspecified: Secondary | ICD-10-CM | POA: Insufficient documentation

## 2015-11-22 DIAGNOSIS — Z833 Family history of diabetes mellitus: Secondary | ICD-10-CM | POA: Insufficient documentation

## 2015-11-22 DIAGNOSIS — G894 Chronic pain syndrome: Secondary | ICD-10-CM | POA: Diagnosis not present

## 2015-11-22 DIAGNOSIS — Z79899 Other long term (current) drug therapy: Secondary | ICD-10-CM

## 2015-11-22 DIAGNOSIS — M7918 Myalgia, other site: Secondary | ICD-10-CM

## 2015-11-22 MED ORDER — FENTANYL 25 MCG/HR TD PT72: 25.0000 ug | MEDICATED_PATCH | TRANSDERMAL | Status: DC

## 2015-11-22 MED ORDER — HYDROCODONE-ACETAMINOPHEN 5-325 MG PO TABS: 1.0000 | ORAL_TABLET | Freq: Three times a day (TID) | ORAL | Status: DC | PRN

## 2015-11-22 NOTE — Progress Notes (Signed)
Subjective:    Patient ID: Alexander Douglas, male    DOB: Apr 24, 1971, 45 y.o.   MRN: [id number]  HPI: Alexander Douglas is a 45 year old male who returns for follow up for chronic pain and medication refill. He says his pain is located in his neck and left shoulder. He rates his pain 7. His current exercise regime is walking and performing stretching exercises. Alexander Douglas states he's having  financial hardhship due to the facility fee charge and these bills have been sent to collections.  Also states he asked if Dr. Merlene Laughter his neurologist would accept him for pain management he is awaiting an answer. He see's Dr. Merlene Laughter every three months at this time. We will allow him to return in two months, he was instructed to call office if Dr. Merlene Laughter will resume his care he verbalizes understanding.  Pain Inventory Average Pain 6 Pain Right Now 7 My pain is sharp, burning, dull, stabbing, tingling and aching  In the last 24 hours, has pain interfered with the following? General activity 5 Relation with others 8 Enjoyment of life 8 What TIME of day is your pain at its worst? morning and night Sleep (in general) Fair  Pain is worse with: walking, inactivity, standing and some activites Pain improves with: rest, heat/ice, therapy/exercise and medication Relief from Meds: 5  Mobility Do you have any goals in this area?  no  Function Do you have any goals in this area?  no  Neuro/Psych tingling spasms anxiety  Prior Studies Any changes since last visit?  no  Physicians involved in your care Any changes since last visit?  no   Family History  Problem Relation Age of Onset  . Diabetes Mother   . Heart disease Mother   . Hyperlipidemia Mother   . Hypertension Mother    Social History   Social History  . Marital Status: Single    Spouse Name: N/A  . Number of Children: 0  . Years of Education: 14   Occupational History  .      [employer]   Social History  Main Topics  . Smoking status: Never Smoker   . Smokeless tobacco: Current User    Types: Chew     Comment: tobacco  . Alcohol Use: No  . Drug Use: No  . Sexual Activity: Not Asked   Other Topics Concern  . None   Social History Narrative   Patient consumes 4 cups of caffeine and is right handed   Past Surgical History  Procedure Laterality Date  . Foot surgery      Left 8/12 right 10/12  . Wisdom tooth extraction    . Neck surgery     Past Medical History  Diagnosis Date  . Hyperlipidemia   . Depression   . Plantar fasciitis   . Migraine   . Anxiety   . Cervical disc disorder    BP 146/92 mmHg  Pulse 74  SpO2 95%  Opioid Risk Score:   Fall Risk Score:  `1  Depression screen PHQ 2/9  Depression screen [facility] 2/9 11/22/2015 06/29/2015 01/30/2015  Decreased Interest 1 1 1   Down, Depressed, Hopeless 0 0 1  PHQ - 2 Score 1 1 2   Altered sleeping - - 2  Tired, decreased energy - - 2  Change in appetite - - 0  Feeling bad or failure about yourself  - - 0  Trouble concentrating - - 0  Moving slowly or fidgety/restless - -  0  PHQ-9 Score - - 6     Review of Systems  All other systems reviewed and are negative.      Objective:   Physical Exam  Constitutional: He is oriented to person, place, and time. He appears well-developed and well-nourished.  HENT:  Head: Normocephalic and atraumatic.  Neck: Normal range of motion. Neck supple.  Cervical Paraspinal Tenderness: C-5- C-6  Cardiovascular: Normal rate and regular rhythm.   Pulmonary/Chest: Effort normal and breath sounds normal.  Musculoskeletal:  Normal Muscle Bulk and Muscle Testing Reveals: Upper Extremities: Full ROM and Muscle Strength 5/5 Lower Extremities: Full ROM and Muscle Strength 5/5 Arises from chair with ease Narrow Based gait  Neurological: He is alert and oriented to person, place, and time.  Skin: Skin is warm and dry.  Psychiatric: He has a normal mood and affect.  Nursing note and  vitals reviewed.         Assessment & Plan:  1. Cervical Spondylosis: Refilled: Fentanyl Patches one patch every other day. #15 and HYDROcodone 5/325mg  one tablet three times a day as needed #90. Second scripts given for the following month.Continue with heat and ice therapy.  2. Cervical Radiculopathy: Continue Gabapentin.  3.Lumbago: No complaints today: Continue current medication regime, using Voltaren gel 4.Muscle Spasms: Continued Flexeril 5. Depression: Continue Wellbutrin  20 minutes of face to face patient care was spent during this visit. All questions were encouraged and answered.   F/U in 2 month

## 2015-12-22 ENCOUNTER — Other Ambulatory Visit: Payer: Self-pay | Admitting: Physical Medicine & Rehabilitation

## 2016-01-15 ENCOUNTER — Telehealth: Payer: Self-pay | Admitting: Physical Medicine & Rehabilitation

## 2016-01-15 NOTE — Telephone Encounter (Signed)
Patient canceled visit with Alexander Douglas on 01/17/16, stated he will be seeing Dr. Merlene Laughter.  He will see how this goes and let us know if he needs to come back here.

## 2016-01-15 NOTE — Telephone Encounter (Signed)
FYI

## 2016-01-17 ENCOUNTER — Encounter: Payer: BC Managed Care – PPO | Admitting: Registered Nurse

## 2020-03-01 ENCOUNTER — Other Ambulatory Visit (HOSPITAL_COMMUNITY): Payer: Self-pay | Admitting: Neurology

## 2020-03-01 DIAGNOSIS — M25519 Pain in unspecified shoulder: Secondary | ICD-10-CM

## 2020-03-28 DIAGNOSIS — M722 Plantar fascial fibromatosis: Secondary | ICD-10-CM | POA: Diagnosis not present

## 2020-03-28 DIAGNOSIS — Z79891 Long term (current) use of opiate analgesic: Secondary | ICD-10-CM | POA: Diagnosis not present

## 2020-03-28 DIAGNOSIS — I776 Arteritis, unspecified: Secondary | ICD-10-CM | POA: Diagnosis not present

## 2020-03-28 DIAGNOSIS — M79673 Pain in unspecified foot: Secondary | ICD-10-CM | POA: Diagnosis not present

## 2020-03-28 DIAGNOSIS — F419 Anxiety disorder, unspecified: Secondary | ICD-10-CM | POA: Diagnosis not present

## 2020-03-28 DIAGNOSIS — M542 Cervicalgia: Secondary | ICD-10-CM | POA: Diagnosis not present

## 2020-03-28 DIAGNOSIS — G4710 Hypersomnia, unspecified: Secondary | ICD-10-CM | POA: Diagnosis not present

## 2020-03-28 DIAGNOSIS — M25519 Pain in unspecified shoulder: Secondary | ICD-10-CM | POA: Diagnosis not present

## 2020-03-28 DIAGNOSIS — M25512 Pain in left shoulder: Secondary | ICD-10-CM | POA: Diagnosis not present

## 2020-03-28 DIAGNOSIS — G4721 Circadian rhythm sleep disorder, delayed sleep phase type: Secondary | ICD-10-CM | POA: Diagnosis not present

## 2020-05-11 DIAGNOSIS — H5711 Ocular pain, right eye: Secondary | ICD-10-CM | POA: Diagnosis not present

## 2020-05-22 DIAGNOSIS — M25519 Pain in unspecified shoulder: Secondary | ICD-10-CM | POA: Diagnosis not present

## 2020-05-22 DIAGNOSIS — M25511 Pain in right shoulder: Secondary | ICD-10-CM | POA: Diagnosis not present

## 2020-05-22 DIAGNOSIS — M722 Plantar fascial fibromatosis: Secondary | ICD-10-CM | POA: Diagnosis not present

## 2020-05-22 DIAGNOSIS — M542 Cervicalgia: Secondary | ICD-10-CM | POA: Diagnosis not present

## 2020-05-22 DIAGNOSIS — F419 Anxiety disorder, unspecified: Secondary | ICD-10-CM | POA: Diagnosis not present

## 2020-05-22 DIAGNOSIS — G4710 Hypersomnia, unspecified: Secondary | ICD-10-CM | POA: Diagnosis not present

## 2020-05-22 DIAGNOSIS — M79673 Pain in unspecified foot: Secondary | ICD-10-CM | POA: Diagnosis not present

## 2020-05-22 DIAGNOSIS — G4721 Circadian rhythm sleep disorder, delayed sleep phase type: Secondary | ICD-10-CM | POA: Diagnosis not present

## 2020-05-22 DIAGNOSIS — Z79891 Long term (current) use of opiate analgesic: Secondary | ICD-10-CM | POA: Diagnosis not present

## 2020-07-19 DIAGNOSIS — G4710 Hypersomnia, unspecified: Secondary | ICD-10-CM | POA: Diagnosis not present

## 2020-07-19 DIAGNOSIS — F419 Anxiety disorder, unspecified: Secondary | ICD-10-CM | POA: Diagnosis not present

## 2020-07-19 DIAGNOSIS — G4721 Circadian rhythm sleep disorder, delayed sleep phase type: Secondary | ICD-10-CM | POA: Diagnosis not present

## 2020-07-19 DIAGNOSIS — M25511 Pain in right shoulder: Secondary | ICD-10-CM | POA: Diagnosis not present

## 2020-07-19 DIAGNOSIS — M79673 Pain in unspecified foot: Secondary | ICD-10-CM | POA: Diagnosis not present

## 2020-07-19 DIAGNOSIS — M25519 Pain in unspecified shoulder: Secondary | ICD-10-CM | POA: Diagnosis not present

## 2020-07-19 DIAGNOSIS — M542 Cervicalgia: Secondary | ICD-10-CM | POA: Diagnosis not present

## 2020-07-19 DIAGNOSIS — M722 Plantar fascial fibromatosis: Secondary | ICD-10-CM | POA: Diagnosis not present

## 2020-07-19 DIAGNOSIS — Z79891 Long term (current) use of opiate analgesic: Secondary | ICD-10-CM | POA: Diagnosis not present

## 2020-09-06 ENCOUNTER — Ambulatory Visit: Payer: Self-pay

## 2020-09-12 DIAGNOSIS — E559 Vitamin D deficiency, unspecified: Secondary | ICD-10-CM | POA: Diagnosis not present

## 2020-09-12 DIAGNOSIS — I776 Arteritis, unspecified: Secondary | ICD-10-CM | POA: Diagnosis not present

## 2020-09-12 DIAGNOSIS — Z79899 Other long term (current) drug therapy: Secondary | ICD-10-CM | POA: Diagnosis not present

## 2020-10-10 ENCOUNTER — Other Ambulatory Visit (HOSPITAL_COMMUNITY): Payer: Self-pay | Admitting: Neurology

## 2020-10-10 DIAGNOSIS — Z800 Family history of malignant neoplasm of digestive organs: Secondary | ICD-10-CM | POA: Diagnosis not present

## 2020-10-10 DIAGNOSIS — I82409 Acute embolism and thrombosis of unspecified deep veins of unspecified lower extremity: Secondary | ICD-10-CM

## 2020-10-10 DIAGNOSIS — R58 Hemorrhage, not elsewhere classified: Secondary | ICD-10-CM | POA: Diagnosis not present

## 2020-10-10 DIAGNOSIS — K429 Umbilical hernia without obstruction or gangrene: Secondary | ICD-10-CM | POA: Diagnosis not present

## 2020-10-10 DIAGNOSIS — R1012 Left upper quadrant pain: Secondary | ICD-10-CM | POA: Diagnosis not present

## 2020-10-11 ENCOUNTER — Other Ambulatory Visit (HOSPITAL_COMMUNITY): Payer: Self-pay | Admitting: Neurology

## 2020-10-11 ENCOUNTER — Other Ambulatory Visit: Payer: Self-pay | Admitting: Neurology

## 2020-10-11 DIAGNOSIS — M25519 Pain in unspecified shoulder: Secondary | ICD-10-CM | POA: Diagnosis not present

## 2020-10-11 DIAGNOSIS — M722 Plantar fascial fibromatosis: Secondary | ICD-10-CM | POA: Diagnosis not present

## 2020-10-11 DIAGNOSIS — M25511 Pain in right shoulder: Secondary | ICD-10-CM | POA: Diagnosis not present

## 2020-10-11 DIAGNOSIS — G4710 Hypersomnia, unspecified: Secondary | ICD-10-CM | POA: Diagnosis not present

## 2020-10-11 DIAGNOSIS — I82409 Acute embolism and thrombosis of unspecified deep veins of unspecified lower extremity: Secondary | ICD-10-CM

## 2020-10-11 DIAGNOSIS — Z79891 Long term (current) use of opiate analgesic: Secondary | ICD-10-CM | POA: Diagnosis not present

## 2020-10-11 DIAGNOSIS — M79673 Pain in unspecified foot: Secondary | ICD-10-CM | POA: Diagnosis not present

## 2020-10-11 DIAGNOSIS — M7989 Other specified soft tissue disorders: Secondary | ICD-10-CM

## 2020-10-11 DIAGNOSIS — G4721 Circadian rhythm sleep disorder, delayed sleep phase type: Secondary | ICD-10-CM | POA: Diagnosis not present

## 2020-10-11 DIAGNOSIS — M542 Cervicalgia: Secondary | ICD-10-CM | POA: Diagnosis not present

## 2020-10-11 DIAGNOSIS — F419 Anxiety disorder, unspecified: Secondary | ICD-10-CM | POA: Diagnosis not present

## 2020-10-17 ENCOUNTER — Ambulatory Visit (HOSPITAL_COMMUNITY)
Admission: RE | Admit: 2020-10-17 | Discharge: 2020-10-17 | Disposition: A | Discharge status: Home / Self Care | Payer: Medicare HMO | Source: Ambulatory Visit | Attending: Neurology | Admitting: Neurology

## 2020-10-17 ENCOUNTER — Other Ambulatory Visit: Payer: Self-pay

## 2020-10-17 DIAGNOSIS — M7989 Other specified soft tissue disorders: Secondary | ICD-10-CM | POA: Diagnosis not present

## 2020-10-17 DIAGNOSIS — I82409 Acute embolism and thrombosis of unspecified deep veins of unspecified lower extremity: Secondary | ICD-10-CM | POA: Diagnosis not present

## 2020-10-18 DIAGNOSIS — Z01818 Encounter for other preprocedural examination: Secondary | ICD-10-CM | POA: Diagnosis not present

## 2020-10-20 DIAGNOSIS — R1031 Right lower quadrant pain: Secondary | ICD-10-CM | POA: Diagnosis not present

## 2020-10-20 DIAGNOSIS — K429 Umbilical hernia without obstruction or gangrene: Secondary | ICD-10-CM | POA: Diagnosis not present

## 2020-10-20 DIAGNOSIS — G479 Sleep disorder, unspecified: Secondary | ICD-10-CM | POA: Diagnosis not present

## 2020-10-20 DIAGNOSIS — Z79899 Other long term (current) drug therapy: Secondary | ICD-10-CM | POA: Diagnosis not present

## 2020-10-20 DIAGNOSIS — F419 Anxiety disorder, unspecified: Secondary | ICD-10-CM | POA: Diagnosis not present

## 2020-10-20 DIAGNOSIS — Z800 Family history of malignant neoplasm of digestive organs: Secondary | ICD-10-CM | POA: Diagnosis not present

## 2020-10-20 DIAGNOSIS — R1012 Left upper quadrant pain: Secondary | ICD-10-CM | POA: Diagnosis not present

## 2020-11-22 DIAGNOSIS — Z01818 Encounter for other preprocedural examination: Secondary | ICD-10-CM | POA: Diagnosis not present

## 2020-11-22 DIAGNOSIS — K429 Umbilical hernia without obstruction or gangrene: Secondary | ICD-10-CM | POA: Diagnosis not present

## 2020-11-24 DIAGNOSIS — G479 Sleep disorder, unspecified: Secondary | ICD-10-CM | POA: Diagnosis not present

## 2020-11-24 DIAGNOSIS — K429 Umbilical hernia without obstruction or gangrene: Secondary | ICD-10-CM | POA: Diagnosis not present

## 2020-11-24 DIAGNOSIS — Z79899 Other long term (current) drug therapy: Secondary | ICD-10-CM | POA: Diagnosis not present

## 2020-12-06 DIAGNOSIS — M722 Plantar fascial fibromatosis: Secondary | ICD-10-CM | POA: Diagnosis not present

## 2020-12-06 DIAGNOSIS — Z79891 Long term (current) use of opiate analgesic: Secondary | ICD-10-CM | POA: Diagnosis not present

## 2020-12-06 DIAGNOSIS — G4710 Hypersomnia, unspecified: Secondary | ICD-10-CM | POA: Diagnosis not present

## 2020-12-06 DIAGNOSIS — G4721 Circadian rhythm sleep disorder, delayed sleep phase type: Secondary | ICD-10-CM | POA: Diagnosis not present

## 2020-12-06 DIAGNOSIS — M79673 Pain in unspecified foot: Secondary | ICD-10-CM | POA: Diagnosis not present

## 2020-12-06 DIAGNOSIS — M542 Cervicalgia: Secondary | ICD-10-CM | POA: Diagnosis not present

## 2020-12-06 DIAGNOSIS — M25511 Pain in right shoulder: Secondary | ICD-10-CM | POA: Diagnosis not present

## 2020-12-06 DIAGNOSIS — M25519 Pain in unspecified shoulder: Secondary | ICD-10-CM | POA: Diagnosis not present

## 2020-12-06 DIAGNOSIS — F419 Anxiety disorder, unspecified: Secondary | ICD-10-CM | POA: Diagnosis not present

## 2021-02-20 DIAGNOSIS — K5900 Constipation, unspecified: Secondary | ICD-10-CM | POA: Diagnosis not present

## 2021-02-20 DIAGNOSIS — R1012 Left upper quadrant pain: Secondary | ICD-10-CM | POA: Diagnosis not present

## 2021-02-27 DIAGNOSIS — M722 Plantar fascial fibromatosis: Secondary | ICD-10-CM | POA: Diagnosis not present

## 2021-02-27 DIAGNOSIS — M25519 Pain in unspecified shoulder: Secondary | ICD-10-CM | POA: Diagnosis not present

## 2021-02-27 DIAGNOSIS — M542 Cervicalgia: Secondary | ICD-10-CM | POA: Diagnosis not present

## 2021-02-27 DIAGNOSIS — M79673 Pain in unspecified foot: Secondary | ICD-10-CM | POA: Diagnosis not present

## 2021-02-27 DIAGNOSIS — M25511 Pain in right shoulder: Secondary | ICD-10-CM | POA: Diagnosis not present

## 2021-02-27 DIAGNOSIS — G4710 Hypersomnia, unspecified: Secondary | ICD-10-CM | POA: Diagnosis not present

## 2021-02-27 DIAGNOSIS — G4721 Circadian rhythm sleep disorder, delayed sleep phase type: Secondary | ICD-10-CM | POA: Diagnosis not present

## 2021-02-27 DIAGNOSIS — M25512 Pain in left shoulder: Secondary | ICD-10-CM | POA: Diagnosis not present

## 2021-02-27 DIAGNOSIS — Z79891 Long term (current) use of opiate analgesic: Secondary | ICD-10-CM | POA: Diagnosis not present

## 2021-02-28 ENCOUNTER — Encounter (INDEPENDENT_AMBULATORY_CARE_PROVIDER_SITE_OTHER): Payer: Self-pay | Admitting: *Deleted

## 2021-04-01 DIAGNOSIS — H40033 Anatomical narrow angle, bilateral: Secondary | ICD-10-CM | POA: Diagnosis not present

## 2021-04-01 DIAGNOSIS — H04123 Dry eye syndrome of bilateral lacrimal glands: Secondary | ICD-10-CM | POA: Diagnosis not present

## 2021-04-11 DIAGNOSIS — H1013 Acute atopic conjunctivitis, bilateral: Secondary | ICD-10-CM | POA: Diagnosis not present

## 2021-04-24 DIAGNOSIS — M79673 Pain in unspecified foot: Secondary | ICD-10-CM | POA: Diagnosis not present

## 2021-04-24 DIAGNOSIS — G4721 Circadian rhythm sleep disorder, delayed sleep phase type: Secondary | ICD-10-CM | POA: Diagnosis not present

## 2021-04-24 DIAGNOSIS — M722 Plantar fascial fibromatosis: Secondary | ICD-10-CM | POA: Diagnosis not present

## 2021-04-24 DIAGNOSIS — Z79891 Long term (current) use of opiate analgesic: Secondary | ICD-10-CM | POA: Diagnosis not present

## 2021-04-24 DIAGNOSIS — M542 Cervicalgia: Secondary | ICD-10-CM | POA: Diagnosis not present

## 2021-04-24 DIAGNOSIS — F419 Anxiety disorder, unspecified: Secondary | ICD-10-CM | POA: Diagnosis not present

## 2021-04-24 DIAGNOSIS — M25511 Pain in right shoulder: Secondary | ICD-10-CM | POA: Diagnosis not present

## 2021-04-24 DIAGNOSIS — M25519 Pain in unspecified shoulder: Secondary | ICD-10-CM | POA: Diagnosis not present

## 2021-04-24 DIAGNOSIS — G4710 Hypersomnia, unspecified: Secondary | ICD-10-CM | POA: Diagnosis not present

## 2021-05-22 DIAGNOSIS — M79673 Pain in unspecified foot: Secondary | ICD-10-CM | POA: Diagnosis not present

## 2021-05-22 DIAGNOSIS — M542 Cervicalgia: Secondary | ICD-10-CM | POA: Diagnosis not present

## 2021-05-22 DIAGNOSIS — M25511 Pain in right shoulder: Secondary | ICD-10-CM | POA: Diagnosis not present

## 2021-05-22 DIAGNOSIS — G4721 Circadian rhythm sleep disorder, delayed sleep phase type: Secondary | ICD-10-CM | POA: Diagnosis not present

## 2021-05-22 DIAGNOSIS — M25519 Pain in unspecified shoulder: Secondary | ICD-10-CM | POA: Diagnosis not present

## 2021-05-22 DIAGNOSIS — M722 Plantar fascial fibromatosis: Secondary | ICD-10-CM | POA: Diagnosis not present

## 2021-05-22 DIAGNOSIS — F419 Anxiety disorder, unspecified: Secondary | ICD-10-CM | POA: Diagnosis not present

## 2021-05-22 DIAGNOSIS — G4710 Hypersomnia, unspecified: Secondary | ICD-10-CM | POA: Diagnosis not present

## 2021-05-22 DIAGNOSIS — Z79891 Long term (current) use of opiate analgesic: Secondary | ICD-10-CM | POA: Diagnosis not present

## 2021-06-07 ENCOUNTER — Ambulatory Visit (INDEPENDENT_AMBULATORY_CARE_PROVIDER_SITE_OTHER): Payer: Medicare HMO | Admitting: Clinical

## 2021-06-07 ENCOUNTER — Other Ambulatory Visit: Payer: Self-pay

## 2021-06-07 DIAGNOSIS — F331 Major depressive disorder, recurrent, moderate: Secondary | ICD-10-CM

## 2021-06-07 DIAGNOSIS — F419 Anxiety disorder, unspecified: Secondary | ICD-10-CM
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
## 2021-06-21 ENCOUNTER — Other Ambulatory Visit: Payer: Self-pay

## 2021-06-21 ENCOUNTER — Encounter (INDEPENDENT_AMBULATORY_CARE_PROVIDER_SITE_OTHER): Payer: Self-pay | Admitting: Gastroenterology

## 2021-06-21 ENCOUNTER — Ambulatory Visit (INDEPENDENT_AMBULATORY_CARE_PROVIDER_SITE_OTHER): Payer: Medicare HMO | Admitting: Gastroenterology

## 2021-06-21 DIAGNOSIS — K5903 Drug induced constipation: Secondary | ICD-10-CM | POA: Diagnosis not present

## 2021-06-21 DIAGNOSIS — K5900 Constipation, unspecified: Secondary | ICD-10-CM | POA: Insufficient documentation

## 2021-06-21 MED ORDER — LINACLOTIDE 72 MCG PO CAPS: 72.0000 ug | ORAL_CAPSULE | Freq: Every day | ORAL | 1 refills | Status: DC

## 2021-06-21 NOTE — Progress Notes (Signed)
Alexander Douglas, M.D. Gastroenterology & Hepatology [facility] [address] [place], [place] [address] Primary Care Physician: [employer], [place] [phone]  Referring MD: PCP  Chief Complaint: Chronic constipation and abdominal pain  History of Present Illness: Alexander Douglas is a 51 y.o. male with past medical history of degenerative disc disease on opiates, depression, hyperlipidemia, plantar fasciitis who presents for evaluation of constipation and abdominal pain.  States that he has presented  intermittent episodes of pain, initially located 2 inches above his umbilical area. He reports that this has been going on since November 2021. States that sometimes the pain migrates to the LUQ but mostly is in the periumbilical area. He reports that initially the pain was infrequent but eventually became a daily pain. He reports that he is currently taking over the counter stool softeners ([employer] brand - Docusate and Senna) which has led to improvement his symptoms. He has noticed that after having a BM his pain goes away. Has pain possibly two times a week. He took Miralax daily which helped some but not as good as the stool softener. He notices some blood intermittently, but states he was told he did not have hemorrhoids in his most recent colonoscopy.  Patient saw Dr. Rocco Serene when his symptoms started.  Part of the symptoms were attributed to a hernia in his abdomen.  Reports he had hernia surgery in January 2022, reports the symptoms improved with the procedure but he still had some pain episodes.   Notably, he reports that he has presented decreased appetite which has led to slight decrease in his weight but not a major decrease.  As part of the evaluation for his abdominal pain, he underwent a CT scan on 02/20/2021 at [facility].  No images are available but official report describes moderate stool  burden throughout the colon.  3 mm subpleural left lower lobe pulmonary nodule.   Importantly, he has been taking oxycodone and hydrocodone since 1997, for management of spine stenosis. Currently takes oxycodone 10 mg BID. He is also on buprenorphine 8 mg.  The patient denies having any nausea, vomiting, fever, chills, hematochezia, melena, hematemesis, abdominal distention,  diarrhea, jaundice, pruritus or weight loss.  Last [id number] Last Colonoscopy:performed in 10/2020 by Dr. Adelina Mings, per notes it states it was normal  Past Medical History: Past Medical History:  Diagnosis Date   Anxiety    Cervical disc disorder    Depression    Hyperlipidemia    Migraine    Plantar fasciitis     Past Surgical History: Past Surgical History:  Procedure Laterality Date   FOOT SURGERY     Left 8/12 right 10/12   NECK SURGERY     WISDOM TOOTH EXTRACTION      Family History: Family History  Problem Relation Age of Onset   Diabetes Mother    Heart disease Mother    Hyperlipidemia Mother    Hypertension Mother     Social History: Social History   Tobacco Use  Smoking Status Never  Smokeless Tobacco Current   Types: Chew  Tobacco Comments   tobacco   Social History   Substance and Sexual Activity  Alcohol Use No   Alcohol/week: 0.0 standard drinks   Social History   Substance and Sexual Activity  Drug Use No    Allergies: Allergies  Allergen Reactions   Bee Venom     swelling   Seasonal Ic [Cholestatin]  Runny nose, headaches, scratching throat    Medications: Current Outpatient Medications  Medication Sig Dispense Refill   amphetamine-dextroamphetamine (ADDERALL) 10 MG tablet dextroamphetamine-amphetamine 10 mg tablet  TAKE ONE TABLET BY MOUTH TWICE DAILY AS NEEDED     buprenorphine (SUBUTEX) 8 MG SUBL SL tablet buprenorphine HCl 8 mg sublingual tablet  PLACE ONE & ONE-HALF TABLETS UNDER THE TONGUE DAILY.     buPROPion (WELLBUTRIN XL) 300 MG 24 hr  tablet Take 300 mg by mouth daily.      clonazePAM (KLONOPIN) 1 MG tablet Take 1 mg by mouth 2 (two) times daily.     cyclobenzaprine (FLEXERIL) 5 MG tablet TAKE ONE TABLET BY MOUTH THREE TIMES DAILY AS NEEDED FOR MUSCLE SPASMS 90 tablet 0   linaclotide (LINZESS) 72 MCG capsule Take 1 capsule (72 mcg total) by mouth daily before breakfast. 90 capsule 1   NASONEX 50 MCG/ACT nasal spray Place 2 sprays into the nose daily. Each nostril  2   oxyCODONE-acetaminophen (PERCOCET) 10-325 MG tablet oxycodone-acetaminophen 10 mg-325 mg tablet  TAKE ONE TABLET BY MOUTH TWICE DAILY AS NEEDED     No current facility-administered medications for this visit.    Review of Systems: GENERAL: negative for malaise, night sweats HEENT: No changes in hearing or vision, no nose bleeds or other nasal problems. NECK: Negative for lumps, goiter, pain and significant neck swelling RESPIRATORY: Negative for cough, wheezing CARDIOVASCULAR: Negative for chest pain, leg swelling, palpitations, orthopnea GI: SEE HPI MUSCULOSKELETAL: Negative for joint pain or swelling, back pain, and muscle pain. SKIN: Negative for lesions, rash PSYCH: Negative for sleep disturbance, mood disorder and recent psychosocial stressors. HEMATOLOGY Negative for prolonged bleeding, bruising easily, and swollen nodes. ENDOCRINE: Negative for cold or heat intolerance, polyuria, polydipsia and goiter. NEURO: negative for tremor, gait imbalance, syncope and seizures. The remainder of the review of systems is noncontributory.   Physical Exam: BP (!) 145/81 (BP Location: Right Arm, Patient Position: Sitting, Cuff Size: Normal)   Pulse 89   Temp 98.2 F (36.8 C) (Oral)   Ht '5\' 11"'$  (1.803 m)   Wt 160 lb (72.6 kg)   BMI 22.32 kg/m  GENERAL: The patient is AO x3, in no acute distress. HEENT: Head is normocephalic and atraumatic. EOMI are intact. Mouth is well hydrated and without lesions. NECK: Supple. No masses LUNGS: Clear to auscultation.  No presence of rhonchi/wheezing/rales. Adequate chest expansion HEART: RRR, normal s1 and s2. ABDOMEN: Soft, nontender, no guarding, no peritoneal signs, and nondistended. BS +. No masses. EXTREMITIES: Without any cyanosis, clubbing, rash, lesions or edema. NEUROLOGIC: AOx3, no focal motor deficit. SKIN: no jaundice, no rashes   Imaging/Labs: as above  I personally reviewed and interpreted the available labs, imaging and endoscopic files.  Impression and Plan: Alexander Douglas is a 50 y.o. male with past medical history of degenerative disc disease on opiates, depression, hyperlipidemia, plantar fasciitis who presents for evaluation of constipation and abdominal pain.  The patient has presented chronic symptoms of abdominal pain which improved with defecation.  He has also presented hard stools and significant constipation in the setting of chronic opiate intake.  He had a recent CT scan and a colonoscopy at an outside facility that did not show any source for his abdominal pain or presence of any malignancy.  Was noted that he had a large amount of stool which I consider is the reason why he has presented his current complaints.  His symptoms correspond to opiate induced constipation.  He did not have  too much improvement with use of MiraLAX in the past although he was not taking it at its max dose.  We will attempt treating his symptoms with Linzess 72 mcg every day (samples will be provided today), he can take the stool softener as needed if this medication does not achieve complete control of his symptoms.  He noted some episodes of discomfort in his upper abdomen which could be related to bowel hypersensitivity due to opiates.  If the symptoms worsen or persists despite having bowel movements, we will consider performing an EGD in the future.  - Start Linzess 72 mcg qday - Can take stool softener if not achieving symptom control with Linzess  All questions were answered.      Alexander Peppers, MD Gastroenterology and Hepatology [facility]

## 2021-06-21 NOTE — Patient Instructions (Addendum)
Start Linzess 72 mcg qday Can take stool softener if not achieving symptom control with Linzess

## 2021-06-28 ENCOUNTER — Other Ambulatory Visit: Payer: Self-pay

## 2021-06-28 ENCOUNTER — Ambulatory Visit (INDEPENDENT_AMBULATORY_CARE_PROVIDER_SITE_OTHER): Payer: Medicare HMO | Admitting: Clinical

## 2021-06-28 DIAGNOSIS — F419 Anxiety disorder, unspecified: Secondary | ICD-10-CM

## 2021-06-28 DIAGNOSIS — F331 Major depressive disorder, recurrent, moderate: Secondary | ICD-10-CM | POA: Diagnosis not present

## 2021-06-28 NOTE — Progress Notes (Signed)
Virtual Visit via Video Note  I connected with Alexander Douglas on 06/28/21 at  3:00 PM EDT by a video enabled telemedicine application and verified that I am speaking with the correct person using two identifiers.  Location: Patient: Home Provider: Office   I discussed the limitations of evaluation and management by telemedicine and the availability of in person appointments. The patient expressed understanding and agreed to proceed.  THERAPIST PROGRESS NOTE   Session Time: 3:00 PM-3:55 PM   Participation Level: Active   Behavioral Response: Casual Alert Anxious/Depressed   Type of Therapy: Individual Therapy   Treatment Goals addressed: Coping   Interventions: CBT, Motivational Interviewing, Solution Focused and Supportive   Summary: Alexander Douglas is a 50 y.o. male who presents with Depression with Anxiety.The OPT therapist worked with the patient for his initial OPT treatment. The OPT therapist utilized Motivational Interviewing to assist in creating therapeutic repore. The patient in the session was engaged and work in collaboration giving feedback about his triggers and symptoms over the past few weeks. The patient spoke about difficulty with his sleep cycle, working on car issues, and difficulty with girlfriend who he moved in prior to being terminated.The OPT therapist utilized Cognitive Behavioral Therapy through cognitive restructuring as well as worked with the patient to implement coping strategies.The OPT therapist worked in this session with the patient on  communicating with his support system. The OPT therapist worked with the patient to overview stressors. The patient identified stress from his relationship with his girlfriend who is also disabled who he feels like has unrealistic expectations for the future. Additionally the patient identifies his health as a serious stressor and trigger which has limited his physical activity. The patient spoke about getting close to having  his Jeep fixed and this will allow him to re-engage in leisure activities.  Suicidal/Homicidal: Nowithout intent/plan   Therapist Response:The OPT therapist worked with the patient for the patients scheduled session. The patient was engaged in his session and gave feedback in relation to triggers, symptoms, and behavior responses over the past few weeks. The OPT therapist worked with the patient utilizing an in session Cognitive Behavioral Therapy exercise. The patient was responsive in the session and verbalized agreement to put more effort into  using coping skills. The OPT therapist worked with the patient providing psycho-education. The patient will continue to work on managing his stressors and implementing coping strategies.  The patient agreed to better work to manage his mood and utilizing his support network.    Plan: Return again in 2/3 weeks.   Diagnosis:      Axis I:  Recurrent moderate major depressive disorder with anxiety.                           Axis II: No diagnosis   I discussed the assessment and treatment plan with the patient. The patient was provided an opportunity to ask questions and all were answered. The patient agreed with the plan and demonstrated an understanding of the instructions.   The patient was advised to call back or seek an in-person evaluation if the symptoms worsen or if the condition fails to improve as anticipated.   I provided 55 minutes of non-face-to-face time during this encounter.   Lennox Grumbles, LCSW   06/28/2021

## 2021-07-18 DIAGNOSIS — G4721 Circadian rhythm sleep disorder, delayed sleep phase type: Secondary | ICD-10-CM | POA: Diagnosis not present

## 2021-07-18 DIAGNOSIS — Z79891 Long term (current) use of opiate analgesic: Secondary | ICD-10-CM | POA: Diagnosis not present

## 2021-07-18 DIAGNOSIS — M25519 Pain in unspecified shoulder: Secondary | ICD-10-CM | POA: Diagnosis not present

## 2021-07-18 DIAGNOSIS — M25512 Pain in left shoulder: Secondary | ICD-10-CM | POA: Diagnosis not present

## 2021-07-18 DIAGNOSIS — M79673 Pain in unspecified foot: Secondary | ICD-10-CM | POA: Diagnosis not present

## 2021-07-18 DIAGNOSIS — M542 Cervicalgia: Secondary | ICD-10-CM | POA: Diagnosis not present

## 2021-07-18 DIAGNOSIS — M722 Plantar fascial fibromatosis: Secondary | ICD-10-CM | POA: Diagnosis not present

## 2021-07-18 DIAGNOSIS — G4710 Hypersomnia, unspecified: Secondary | ICD-10-CM | POA: Diagnosis not present

## 2021-07-18 DIAGNOSIS — M25511 Pain in right shoulder: Secondary | ICD-10-CM | POA: Diagnosis not present

## 2021-07-19 ENCOUNTER — Other Ambulatory Visit: Payer: Self-pay

## 2021-07-19 ENCOUNTER — Ambulatory Visit (HOSPITAL_COMMUNITY): Payer: Medicare HMO | Admitting: Clinical

## 2021-09-14 DIAGNOSIS — M542 Cervicalgia: Secondary | ICD-10-CM | POA: Diagnosis not present

## 2021-09-14 DIAGNOSIS — M25519 Pain in unspecified shoulder: Secondary | ICD-10-CM | POA: Diagnosis not present

## 2021-09-14 DIAGNOSIS — Z79891 Long term (current) use of opiate analgesic: Secondary | ICD-10-CM | POA: Diagnosis not present

## 2021-09-14 DIAGNOSIS — F419 Anxiety disorder, unspecified: Secondary | ICD-10-CM | POA: Diagnosis not present

## 2021-09-14 DIAGNOSIS — G4721 Circadian rhythm sleep disorder, delayed sleep phase type: Secondary | ICD-10-CM | POA: Diagnosis not present

## 2021-09-14 DIAGNOSIS — G4710 Hypersomnia, unspecified: Secondary | ICD-10-CM | POA: Diagnosis not present

## 2021-09-14 DIAGNOSIS — M722 Plantar fascial fibromatosis: Secondary | ICD-10-CM | POA: Diagnosis not present

## 2021-09-14 DIAGNOSIS — M79673 Pain in unspecified foot: Secondary | ICD-10-CM | POA: Diagnosis not present

## 2021-09-14 DIAGNOSIS — M25511 Pain in right shoulder: Secondary | ICD-10-CM | POA: Diagnosis not present

## 2021-10-08 DIAGNOSIS — F419 Anxiety disorder, unspecified: Secondary | ICD-10-CM | POA: Diagnosis not present

## 2021-10-08 DIAGNOSIS — Z79891 Long term (current) use of opiate analgesic: Secondary | ICD-10-CM | POA: Diagnosis not present

## 2021-10-08 DIAGNOSIS — G4710 Hypersomnia, unspecified: Secondary | ICD-10-CM | POA: Diagnosis not present

## 2021-10-08 DIAGNOSIS — M25511 Pain in right shoulder: Secondary | ICD-10-CM | POA: Diagnosis not present

## 2021-10-08 DIAGNOSIS — M542 Cervicalgia: Secondary | ICD-10-CM | POA: Diagnosis not present

## 2021-10-08 DIAGNOSIS — M722 Plantar fascial fibromatosis: Secondary | ICD-10-CM | POA: Diagnosis not present

## 2021-10-08 DIAGNOSIS — M25519 Pain in unspecified shoulder: Secondary | ICD-10-CM | POA: Diagnosis not present

## 2021-10-08 DIAGNOSIS — G4721 Circadian rhythm sleep disorder, delayed sleep phase type: Secondary | ICD-10-CM | POA: Diagnosis not present

## 2021-10-08 DIAGNOSIS — M79673 Pain in unspecified foot: Secondary | ICD-10-CM | POA: Diagnosis not present

## 2021-10-17 DIAGNOSIS — M25511 Pain in right shoulder: Secondary | ICD-10-CM | POA: Diagnosis not present

## 2021-10-17 DIAGNOSIS — M25512 Pain in left shoulder: Secondary | ICD-10-CM | POA: Diagnosis not present

## 2021-10-21 DIAGNOSIS — M25511 Pain in right shoulder: Secondary | ICD-10-CM | POA: Diagnosis not present

## 2021-10-22 ENCOUNTER — Ambulatory Visit (INDEPENDENT_AMBULATORY_CARE_PROVIDER_SITE_OTHER): Payer: Medicare HMO | Admitting: Gastroenterology

## 2021-10-29 DIAGNOSIS — M25511 Pain in right shoulder: Secondary | ICD-10-CM | POA: Diagnosis not present

## 2021-11-16 DIAGNOSIS — Z79891 Long term (current) use of opiate analgesic: Secondary | ICD-10-CM | POA: Diagnosis not present

## 2021-11-16 DIAGNOSIS — G4721 Circadian rhythm sleep disorder, delayed sleep phase type: Secondary | ICD-10-CM | POA: Diagnosis not present

## 2021-11-16 DIAGNOSIS — F419 Anxiety disorder, unspecified: Secondary | ICD-10-CM | POA: Diagnosis not present

## 2021-11-16 DIAGNOSIS — M542 Cervicalgia: Secondary | ICD-10-CM | POA: Diagnosis not present

## 2021-11-16 DIAGNOSIS — G4710 Hypersomnia, unspecified: Secondary | ICD-10-CM | POA: Diagnosis not present

## 2021-11-16 DIAGNOSIS — M25519 Pain in unspecified shoulder: Secondary | ICD-10-CM | POA: Diagnosis not present

## 2021-11-16 DIAGNOSIS — M25511 Pain in right shoulder: Secondary | ICD-10-CM | POA: Diagnosis not present

## 2021-11-16 DIAGNOSIS — M722 Plantar fascial fibromatosis: Secondary | ICD-10-CM | POA: Diagnosis not present

## 2021-11-16 DIAGNOSIS — M79673 Pain in unspecified foot: Secondary | ICD-10-CM | POA: Diagnosis not present

## 2021-11-20 ENCOUNTER — Other Ambulatory Visit (HOSPITAL_COMMUNITY): Payer: Self-pay | Admitting: Neurology

## 2021-11-20 ENCOUNTER — Other Ambulatory Visit: Payer: Self-pay | Admitting: Neurology

## 2021-11-20 DIAGNOSIS — M542 Cervicalgia: Secondary | ICD-10-CM

## 2021-11-21 NOTE — H&P (Signed)
°  Patient: Alexander Douglas   DOB: 1970/12/08  SEX: Male   Patient referred by DDS for removal bilateral mandibular lingual tori.  CC: Bones under tongue cause speech problems, tongue biting.   Past Medical History:  Seasonal Allergies, Chew Tobacco, Spinal Stenosis, Mental Health problems, Pain and Clicking of Jaw, Depression, Chronic Pain    Medications: Xanax, Subutex, Percocet, Adderall, Wellbutrin, Aspirin    Allergies:     seasonal allergies    Surgeries:   Plantar Fasciitis, Hernia, Oral Surgery, neck surgery     Social History       Smoking: Snuff           Alcohol:n Drug use:n                             Exam: BMI 32. Large bilateral mandibular lingual tori. Almost kissing in anterior portion, constricting movement of tongue.   No purulence, edema, fluctuance, trismus. Oral cancer screening negative. Pharynx clear. No lymphadenopathy.  Panorex:WNL  Assessment: ASA 2.   bilateral mandibular lingual tori            Plan: Removal bilateral mandibular lingual tori.  Hospital Day surgery.                 Rx: n               Risks and complications explained. Questions answered.   Gae Bon, DMD

## 2021-11-26 ENCOUNTER — Other Ambulatory Visit: Payer: Self-pay

## 2021-11-26 ENCOUNTER — Encounter (HOSPITAL_COMMUNITY): Payer: Self-pay | Admitting: Oral Surgery

## 2021-11-26 NOTE — Progress Notes (Signed)
Alexander Douglas denies chest pain or shortness of breath. Patient denies having any s/s of Covid in his household.  Patient denies any known exposure to Covid.   Alexander Douglas does not have a PCP. Patient has a pain Dr. , patient  takes   [place].; I asked  patient if he had discussed a plan when he has surgery, they have not. I instructed patient to not take Subutex tonight or in am.  I instructed Alexander Douglas to shower with antibiotic soap, dry off with a clean towel. Do not put lotion, powder, cologne or deodorant or makeup.No jewelry or piercings. Men may shave their face and neck. Woman should not shave. No nail polish, artificial or acrylic nails. Wear clean clothes, brush your teeth. Glasses, contact lens,dentures or partials may not be worn in the OR. If you need to wear them, please bring a case for glasses, do not wear contacts or bring a case, the hospital does not have contact cases, dentures or partials will have to be removed , make sure they are clean, we will provide a denture cup to put them in. You will need some one to drive you home and a responsible person over the age of 73 to stay with you for the first 24 hours after surgery.

## 2021-11-27 ENCOUNTER — Ambulatory Visit (HOSPITAL_COMMUNITY): Payer: Medicare HMO | Admitting: Anesthesiology

## 2021-11-27 ENCOUNTER — Encounter (HOSPITAL_COMMUNITY)
Admission: RE | Disposition: A | Discharge status: Home / Self Care | Payer: Self-pay | Source: Ambulatory Visit | Attending: Oral Surgery

## 2021-11-27 ENCOUNTER — Other Ambulatory Visit: Payer: Self-pay

## 2021-11-27 ENCOUNTER — Ambulatory Visit (HOSPITAL_COMMUNITY)
Admission: RE | Admit: 2021-11-27 | Discharge: 2021-11-27 | Disposition: A | Discharge status: Home / Self Care | Payer: Medicare HMO | Source: Ambulatory Visit | Attending: Oral Surgery | Admitting: Oral Surgery

## 2021-11-27 ENCOUNTER — Encounter (HOSPITAL_COMMUNITY): Payer: Self-pay | Admitting: Oral Surgery

## 2021-11-27 DIAGNOSIS — M270 Developmental disorders of jaws: Secondary | ICD-10-CM | POA: Diagnosis not present

## 2021-11-27 DIAGNOSIS — F419 Anxiety disorder, unspecified: Secondary | ICD-10-CM | POA: Diagnosis not present

## 2021-11-27 DIAGNOSIS — F32A Depression, unspecified: Secondary | ICD-10-CM | POA: Diagnosis not present

## 2021-11-27 DIAGNOSIS — M278 Other specified diseases of jaws: Secondary | ICD-10-CM | POA: Diagnosis not present

## 2021-11-27 DIAGNOSIS — R69 Illness, unspecified: Secondary | ICD-10-CM | POA: Diagnosis not present

## 2021-11-27 DIAGNOSIS — K0889 Other specified disorders of teeth and supporting structures: Secondary | ICD-10-CM | POA: Diagnosis not present

## 2021-11-27 HISTORY — PX: TOOTH EXTRACTION: SHX859

## 2021-11-27 HISTORY — DX: Unspecified intracranial injury with loss of consciousness of unspecified duration, initial encounter: S06.9X9A

## 2021-11-27 HISTORY — DX: Hypersomnia due to medical condition: G47.14

## 2021-11-27 HISTORY — DX: Fusion of spine, site unspecified: M43.20

## 2021-11-27 LAB — CBC
HCT: 39.8 % (ref 39.0–52.0)
Hemoglobin: 13.4 g/dL (ref 13.0–17.0)
MCH: 32.8 pg (ref 26.0–34.0)
MCHC: 33.7 g/dL (ref 30.0–36.0)
MCV: 97.5 fL (ref 80.0–100.0)
Platelets: 197 10*3/uL (ref 150–400)
RBC: 4.08 MIL/uL — ABNORMAL LOW (ref 4.22–5.81)
RDW: 12.5 % (ref 11.5–15.5)
WBC: 4.8 10*3/uL (ref 4.0–10.5)
nRBC: 0 % (ref 0.0–0.2)

## 2021-11-27 LAB — BASIC METABOLIC PANEL
Anion gap: 8 (ref 5–15)
BUN: 11 mg/dL (ref 6–20)
CO2: 25 mmol/L (ref 22–32)
Calcium: 9.2 mg/dL (ref 8.9–10.3)
Chloride: 105 mmol/L (ref 98–111)
Creatinine, Ser: 0.99 mg/dL (ref 0.61–1.24)
GFR, Estimated: >60 mL/min (ref >60)
Glucose, Bld: 93 mg/dL (ref 70–99)
Potassium: 4.1 mmol/L (ref 3.5–5.1)
Sodium: 138 mmol/L (ref 135–145)

## 2021-11-27 SURGERY — DENTAL RESTORATION/EXTRACTIONS
Anesthesia: General | Site: Mouth

## 2021-11-27 MED ORDER — FENTANYL CITRATE (PF) 100 MCG/2ML IJ SOLN: 25.0000 ug | INTRAMUSCULAR | Status: DC | PRN

## 2021-11-27 MED ORDER — LIDOCAINE 2% (20 MG/ML) 5 ML SYRINGE
INTRAMUSCULAR | Status: DC | PRN
  Administered 2021-11-27: 80 mg via INTRAVENOUS

## 2021-11-27 MED ORDER — MIDAZOLAM HCL 2 MG/2ML IJ SOLN
INTRAMUSCULAR | Status: DC | PRN
Start: 2021-11-27 — End: 2021-11-27
  Administered 2021-11-27: 2 mg via INTRAVENOUS

## 2021-11-27 MED ORDER — CHLORHEXIDINE GLUCONATE 0.12 % MT SOLN
15.0000 mL | Freq: Once | OROMUCOSAL | Status: AC
  Administered 2021-11-27: 15 mL via OROMUCOSAL
  Filled 2021-11-27: qty 15

## 2021-11-27 MED ORDER — ONDANSETRON HCL 4 MG/2ML IJ SOLN
INTRAMUSCULAR | Status: DC | PRN
  Administered 2021-11-27: 4 mg via INTRAVENOUS

## 2021-11-27 MED ORDER — PROPOFOL 10 MG/ML IV BOLUS
INTRAVENOUS | Status: DC | PRN
Start: 2021-11-27 — End: 2021-11-27
  Administered 2021-11-27: 200 mg via INTRAVENOUS

## 2021-11-27 MED ORDER — OXYCODONE HCL 5 MG PO TABS
5.0000 mg | ORAL_TABLET | ORAL | 0 refills | Status: DC | PRN
Start: 2021-11-27 — End: 2021-12-31

## 2021-11-27 MED ORDER — LACTATED RINGERS IV SOLN: INTRAVENOUS | Status: DC

## 2021-11-27 MED ORDER — OXYCODONE HCL 5 MG PO TABS
5.0000 mg | ORAL_TABLET | Freq: Once | ORAL | Status: AC | PRN
  Administered 2021-11-27: 5 mg via ORAL

## 2021-11-27 MED ORDER — OXYMETAZOLINE HCL 0.05 % NA SOLN
NASAL | Status: DC | PRN
Start: 2021-11-27 — End: 2021-11-27
  Administered 2021-11-27 (×2): 2 via NASAL

## 2021-11-27 MED ORDER — LIDOCAINE-EPINEPHRINE 2 %-1:100000 IJ SOLN
INTRAMUSCULAR | Status: DC | PRN
  Administered 2021-11-27: 20 mL via INTRADERMAL

## 2021-11-27 MED ORDER — SUGAMMADEX SODIUM 200 MG/2ML IV SOLN
INTRAVENOUS | Status: DC | PRN
Start: 2021-11-27 — End: 2021-11-27
  Administered 2021-11-27: 160 mg via INTRAVENOUS

## 2021-11-27 MED ORDER — SUCCINYLCHOLINE CHLORIDE 200 MG/10ML IV SOSY
PREFILLED_SYRINGE | INTRAVENOUS | Status: AC
  Filled 2021-11-27: qty 10

## 2021-11-27 MED ORDER — LIDOCAINE-EPINEPHRINE 2 %-1:100000 IJ SOLN
INTRAMUSCULAR | Status: AC
  Filled 2021-11-27: qty 1

## 2021-11-27 MED ORDER — SODIUM CHLORIDE 0.9 % IR SOLN
Status: DC | PRN
  Administered 2021-11-27: 1000 mL

## 2021-11-27 MED ORDER — OXYCODONE HCL 5 MG/5ML PO SOLN: 5.0000 mg | Freq: Once | ORAL | Status: AC | PRN

## 2021-11-27 MED ORDER — ACETAMINOPHEN 325 MG PO TABS
ORAL_TABLET | ORAL | Status: AC
  Administered 2021-11-27: 650 mg via ORAL
  Filled 2021-11-27: qty 2

## 2021-11-27 MED ORDER — OXYMETAZOLINE HCL 0.05 % NA SOLN
NASAL | Status: DC | PRN
  Administered 2021-11-27: 1 via TOPICAL

## 2021-11-27 MED ORDER — EPHEDRINE SULFATE 50 MG/ML IJ SOLN
INTRAMUSCULAR | Status: DC | PRN
  Administered 2021-11-27 (×3): 5 mg via INTRAVENOUS

## 2021-11-27 MED ORDER — OXYMETAZOLINE HCL 0.05 % NA SOLN
NASAL | Status: AC
  Filled 2021-11-27: qty 30

## 2021-11-27 MED ORDER — ACETAMINOPHEN 500 MG PO TABS
1000.0000 mg | ORAL_TABLET | Freq: Once | ORAL | Status: DC
  Filled 2021-11-27: qty 2

## 2021-11-27 MED ORDER — OXYCODONE HCL 5 MG PO TABS
ORAL_TABLET | ORAL | Status: AC
  Filled 2021-11-27: qty 1

## 2021-11-27 MED ORDER — PROMETHAZINE HCL 25 MG/ML IJ SOLN: 6.2500 mg | INTRAMUSCULAR | Status: DC | PRN

## 2021-11-27 MED ORDER — SUCCINYLCHOLINE CHLORIDE 200 MG/10ML IV SOSY
PREFILLED_SYRINGE | INTRAVENOUS | Status: DC | PRN
Start: 2021-11-27 — End: 2021-11-27
  Administered 2021-11-27: 140 mg via INTRAVENOUS

## 2021-11-27 MED ORDER — FENTANYL CITRATE (PF) 250 MCG/5ML IJ SOLN
INTRAMUSCULAR | Status: AC
  Filled 2021-11-27: qty 5

## 2021-11-27 MED ORDER — ONDANSETRON HCL 4 MG/2ML IJ SOLN
INTRAMUSCULAR | Status: AC
  Filled 2021-11-27: qty 2

## 2021-11-27 MED ORDER — DEXAMETHASONE SODIUM PHOSPHATE 10 MG/ML IJ SOLN
INTRAMUSCULAR | Status: DC | PRN
Start: 2021-11-27 — End: 2021-11-27
  Administered 2021-11-27: 10 mg via INTRAVENOUS

## 2021-11-27 MED ORDER — DEXAMETHASONE SODIUM PHOSPHATE 10 MG/ML IJ SOLN
INTRAMUSCULAR | Status: AC
  Filled 2021-11-27: qty 1

## 2021-11-27 MED ORDER — 0.9 % SODIUM CHLORIDE (POUR BTL) OPTIME
TOPICAL | Status: DC | PRN
Start: 2021-11-27 — End: 2021-11-27
  Administered 2021-11-27: 1000 mL

## 2021-11-27 MED ORDER — ORAL CARE MOUTH RINSE: 15.0000 mL | Freq: Once | OROMUCOSAL | Status: AC

## 2021-11-27 MED ORDER — MIDAZOLAM HCL 2 MG/2ML IJ SOLN
INTRAMUSCULAR | Status: AC
  Filled 2021-11-27: qty 2

## 2021-11-27 MED ORDER — ROCURONIUM BROMIDE 10 MG/ML (PF) SYRINGE
PREFILLED_SYRINGE | INTRAVENOUS | Status: AC
  Filled 2021-11-27: qty 10

## 2021-11-27 MED ORDER — FENTANYL CITRATE (PF) 250 MCG/5ML IJ SOLN
INTRAMUSCULAR | Status: DC | PRN
  Administered 2021-11-27: 100 ug via INTRAVENOUS
  Administered 2021-11-27: 50 ug via INTRAVENOUS

## 2021-11-27 MED ORDER — AMOXICILLIN 500 MG PO CAPS: 500.0000 mg | ORAL_CAPSULE | Freq: Three times a day (TID) | ORAL | 0 refills | Status: DC

## 2021-11-27 MED ORDER — CEFAZOLIN SODIUM-DEXTROSE 2-4 GM/100ML-% IV SOLN
2.0000 g | INTRAVENOUS | Status: AC
  Administered 2021-11-27: 2 g via INTRAVENOUS
  Filled 2021-11-27: qty 100

## 2021-11-27 MED ORDER — ACETAMINOPHEN 325 MG PO TABS: 650.0000 mg | ORAL_TABLET | Freq: Once | ORAL | Status: AC

## 2021-11-27 MED ORDER — ROCURONIUM BROMIDE 10 MG/ML (PF) SYRINGE
PREFILLED_SYRINGE | INTRAVENOUS | Status: DC | PRN
  Administered 2021-11-27: 50 mg via INTRAVENOUS
  Administered 2021-11-27: 10 mg via INTRAVENOUS

## 2021-11-27 MED ORDER — LIDOCAINE 2% (20 MG/ML) 5 ML SYRINGE
INTRAMUSCULAR | Status: AC
  Filled 2021-11-27: qty 5

## 2021-11-27 SURGICAL SUPPLY — 37 items
BAG COUNTER SPONGE SURGICOUNT (BAG) IMPLANT
BLADE SURG 15 STRL LF DISP TIS (BLADE) ×1 IMPLANT
BLADE SURG 15 STRL SS (BLADE) ×1
BUR CROSS CUT FISSURE 1.6 (BURR) ×1 IMPLANT
BUR EGG ELITE 4.0 (BURR) ×2 IMPLANT
CANISTER SUCT 3000ML PPV (MISCELLANEOUS) ×2 IMPLANT
COVER SURGICAL LIGHT HANDLE (MISCELLANEOUS) ×2 IMPLANT
DECANTER SPIKE VIAL GLASS SM (MISCELLANEOUS) ×2 IMPLANT
DRAPE U-SHAPE 76X120 STRL (DRAPES) ×2 IMPLANT
GAUZE 4X4 16PLY ~~LOC~~+RFID DBL (SPONGE) ×1 IMPLANT
GAUZE PACKING FOLDED 2  STR (GAUZE/BANDAGES/DRESSINGS) ×1
GAUZE PACKING FOLDED 2 STR (GAUZE/BANDAGES/DRESSINGS) ×1 IMPLANT
GLOVE SURG ENC MOIS LTX SZ6.5 (GLOVE) IMPLANT
GLOVE SURG ENC MOIS LTX SZ7 (GLOVE) IMPLANT
GLOVE SURG ENC MOIS LTX SZ8 (GLOVE) ×2 IMPLANT
GLOVE SURG UNDER POLY LF SZ6.5 (GLOVE) IMPLANT
GLOVE SURG UNDER POLY LF SZ7 (GLOVE) IMPLANT
GOWN STRL REUS W/ TWL LRG LVL3 (GOWN DISPOSABLE) ×1 IMPLANT
GOWN STRL REUS W/ TWL XL LVL3 (GOWN DISPOSABLE) ×1 IMPLANT
GOWN STRL REUS W/TWL LRG LVL3 (GOWN DISPOSABLE) ×1
GOWN STRL REUS W/TWL XL LVL3 (GOWN DISPOSABLE) ×1
IV NS 1000ML (IV SOLUTION) ×1
IV NS 1000ML BAXH (IV SOLUTION) ×1 IMPLANT
KIT BASIN OR (CUSTOM PROCEDURE TRAY) ×2 IMPLANT
KIT TURNOVER KIT B (KITS) ×2 IMPLANT
NDL HYPO 25GX1X1/2 BEV (NEEDLE) ×2 IMPLANT
NEEDLE HYPO 25GX1X1/2 BEV (NEEDLE) ×2 IMPLANT
NS IRRIG 1000ML POUR BTL (IV SOLUTION) ×2 IMPLANT
PAD ARMBOARD 7.5X6 YLW CONV (MISCELLANEOUS) ×2 IMPLANT
SLEEVE IRRIGATION ELITE 7 (MISCELLANEOUS) ×2 IMPLANT
SPONGE SURGIFOAM ABS GEL 12-7 (HEMOSTASIS) IMPLANT
SUT CHROMIC 3 0 PS 2 (SUTURE) ×4 IMPLANT
SYR BULB IRRIG 60ML STRL (SYRINGE) ×2 IMPLANT
SYR CONTROL 10ML LL (SYRINGE) ×2 IMPLANT
TRAY ENT MC OR (CUSTOM PROCEDURE TRAY) ×2 IMPLANT
TUBING IRRIGATION (MISCELLANEOUS) ×2 IMPLANT
YANKAUER SUCT BULB TIP NO VENT (SUCTIONS) ×2 IMPLANT

## 2021-11-27 NOTE — H&P (Signed)
H&P documentation  -History and Physical Reviewed  -Patient has been re-examined  -No change in the plan of care  Alexander Douglas  

## 2021-11-27 NOTE — Anesthesia Preprocedure Evaluation (Addendum)
Anesthesia Evaluation  Patient identified by MRN, date of birth, ID band Patient awake    Reviewed: Allergy & Precautions, NPO status , Patient's Chart, lab work & pertinent test results  History of Anesthesia Complications Negative for: history of anesthetic complications  Airway Mallampati: II  TM Distance: >3 FB Neck ROM: Full    Dental  (+) Poor Dentition   Pulmonary neg pulmonary ROS,    Pulmonary exam normal        Cardiovascular negative cardio ROS   Rhythm:Regular Rate:Normal     Neuro/Psych  Headaches, PSYCHIATRIC DISORDERS Anxiety Depression    GI/Hepatic negative GI ROS, Neg liver ROS,   Endo/Other  negative endocrine ROS  Renal/GU negative Renal ROS     Musculoskeletal  (+) Arthritis ,   Abdominal Normal abdominal exam  (+)   Peds  Hematology negative hematology ROS (+)   Anesthesia Other Findings On subutex   Reproductive/Obstetrics                            Anesthesia Physical Anesthesia Plan  ASA: 2  Anesthesia Plan: General   Post-op Pain Management: Tylenol PO (pre-op)   Induction: Intravenous  PONV Risk Score and Plan: 2 and Treatment may vary due to age or medical condition, Ondansetron, Dexamethasone and Midazolam  Airway Management Planned: Nasal ETT  Additional Equipment: None  Intra-op Plan:   Post-operative Plan: Extubation in OR  Informed Consent: I have reviewed the patients History and Physical, chart, labs and discussed the procedure including the risks, benefits and alternatives for the proposed anesthesia with the patient or authorized representative who has indicated his/her understanding and acceptance.     Dental advisory given  Plan Discussed with: CRNA and Anesthesiologist  Anesthesia Plan Comments:        Anesthesia Quick Evaluation

## 2021-11-27 NOTE — Anesthesia Procedure Notes (Signed)
Procedure Name: Intubation Date/Time: 11/27/2021 12:10 PM Performed by: Michele Rockers, CRNA Pre-anesthesia Checklist: Patient identified, Emergency Drugs available, Suction available and Patient being monitored Patient Re-evaluated:Patient Re-evaluated prior to induction Oxygen Delivery Method: Circle system utilized Preoxygenation: Pre-oxygenation with 100% oxygen Induction Type: IV induction Ventilation: Mask ventilation without difficulty Laryngoscope Size: Glidescope and 4 Grade View: Grade I Nasal Tubes: Nasal prep performed, Nasal Rae and Right Tube size: 7.5 mm Number of attempts: 2 Placement Confirmation: ETT inserted through vocal cords under direct vision, positive ETCO2 and breath sounds checked- equal and bilateral Tube secured with: Tape Dental Injury: Teeth and Oropharynx as per pre-operative assessment

## 2021-11-27 NOTE — Op Note (Signed)
Alexander Douglas, MABEY MEDICAL RECORD NO: [id number] ACCOUNT NO: [id number] DATE OF BIRTH: 1971-02-02 FACILITY: [facility] LOCATION: [facility] PHYSICIAN: Gae Bon, DDS  Operative Report   DATE OF PROCEDURE: 11/27/2021  PREOPERATIVE DIAGNOSIS:  Bilateral mandibular lingual tori.  POSTOPERATIVE DIAGNOSIS:  Bilateral mandibular lingual tori.  PROCEDURE:  Removal of bilateral mandibular lingual tori.  SURGEON:  Gae Bon, DDS  ANESTHESIA:  General, nasal intubation.  Dr. Fransisco Beau attending.  DESCRIPTION OF PROCEDURE:  The patient was taken to the operating room and placed on the table in supine position.  General anesthesia was administered.  A nasal endotracheal tube was placed and secured.  The eyes were protected and the patient was  draped for surgery.  Timeout was performed.  The posterior pharynx was suctioned and a throat pack was placed.  2% lidocaine 1:100,000 epinephrine was infiltrated in an inferior alveolar block on the right and left sides and then buccal and lingual  infiltration of the mandible.  A bite block was placed on the right side of the mouth.  A sweetheart retractor was used to retract the tongue.  A #15 blade was used to make a lingual incision in the gingival sulcus beginning in the area of tooth #24 and  carried forward posteriorly until tooth #18 was encountered at the most proximal area of tooth #18 along the alveolar crest releasing incision was made with a 15 blade and the periosteum was reflected to expose the lingual torus. It was a large torus  approximately 2 cm x 4 cm.  The Stryker handpiece with a fissure bur was used to outline a channel along the medial portion of the torus that would be close to the natural border of the ramus where the torus not there. Then the channel was deepened and  then an osteotome was placed and using a mallet the torus was cleared from the mandible.  Then, the tissue was further dissected and retracted with a sweetheart  retractor and then the Stryker handpiece with an egg bur was used to further smooth the sharp  edges and then the area was smoothed with a bone file and closed with 3-0 chromic interrupted sutures interproximally.  The bite block and sweetheart retractor were repositioned to the other side of the mouth.  The same procedure was performed on the  right torus using a 15 blade to make the incision.  The periosteal elevator was used to reflect the periosteum.  Stryker handpiece was used to create a channel in the bone and then the torus was cleaved using the osteotome and mallet.  The remaining bone  edges were smoothed with the egg bur followed by the bone file and then irrigated and closed with 3-0 chromic.  The oral cavity was irrigated and suctioned.  Additional local anesthesia was administered.  The throat pack was removed.  The patient was  placed under the care of anesthesia for extubation with plans for transport to recovery room and discharge home through day surgery.  ESTIMATED BLOOD LOSS:  Minimal.  COMPLICATIONS:  None.  SPECIMEN:  None.   PUS D: 11/27/2021 1:17:35 pm T: 11/27/2021 2:03:00 pm  JOB: [id number]/ [id number]

## 2021-11-27 NOTE — Transfer of Care (Signed)
Immediate Anesthesia Transfer of Care Note  Patient: Alexander Douglas  Procedure(s) Performed: DENTAL RESTORATION/EXTRACTIONS (Mouth)  Patient Location: PACU  Anesthesia Type:General  Level of Consciousness: drowsy  Airway & Oxygen Therapy: Patient connected to nasal cannula oxygen  Post-op Assessment: Report given to RN and Post -op Vital signs reviewed and stable  Post vital signs: Reviewed and stable  Last Vitals:  Vitals Value Taken Time  BP 144/81 11/27/21 1330  Temp 36.4 C 11/27/21 1330  Pulse 83 11/27/21 1332  Resp 13 11/27/21 1332  SpO2 99 % 11/27/21 1332  Vitals shown include unvalidated device data.  Last Pain:  Vitals:   11/27/21 1039  TempSrc:   PainSc: 0-No pain         Complications: No notable events documented.

## 2021-11-27 NOTE — H&P (Signed)
Anesthesia H&P Update: History and Physical Exam reviewed; patient is OK for planned anesthetic and procedure. ? ?

## 2021-11-27 NOTE — Op Note (Signed)
11/27/2021  1:13 PM  PATIENT:  Alexander Douglas  51 y.o. male  PRE-OPERATIVE DIAGNOSIS:  BILATERAL MANDIBULAR LINGUAL TORI  POST-OPERATIVE DIAGNOSIS:  SAME  PROCEDURE:  Procedure(s): REMOVAL BILATERAL MANDIBULAR LINGUAL TORI   SURGEON:  Surgeon(s): Diona Browner, DMD  ANESTHESIA:   local and general  EBL:  minimal  DRAINS: none   SPECIMEN:  No Specimen  COUNTS:  YES  PLAN OF CARE: Discharge to home after PACU  PATIENT DISPOSITION:  PACU - hemodynamically stable.   PROCEDURE DETAILS: Dictation # [id number]  Gae Bon, DMD 11/27/2021 1:13 PM

## 2021-11-27 NOTE — Anesthesia Postprocedure Evaluation (Signed)
Anesthesia Post Note  Patient: Alexander Douglas  Procedure(s) Performed: DENTAL RESTORATION/EXTRACTIONS (Mouth)     Patient location during evaluation: PACU Anesthesia Type: General Level of consciousness: awake and alert Pain management: pain level controlled Vital Signs Assessment: post-procedure vital signs reviewed and stable Respiratory status: spontaneous breathing, nonlabored ventilation and respiratory function stable Cardiovascular status: stable and blood pressure returned to baseline Anesthetic complications: no   No notable events documented.  Last Vitals:  Vitals:   11/27/21 1330 11/27/21 1345  BP: (!) 144/81 (!) 152/88  Pulse: 78 78  Resp: 15 11  Temp: 36.4 C   SpO2: 99% 96%    Last Pain:  Vitals:   11/27/21 1330  TempSrc:   PainSc: 0-No pain                 Audry Pili

## 2021-11-28 ENCOUNTER — Encounter (HOSPITAL_COMMUNITY): Payer: Self-pay | Admitting: Oral Surgery

## 2021-11-30 ENCOUNTER — Ambulatory Visit (HOSPITAL_COMMUNITY): Payer: Medicare HMO

## 2021-12-06 DIAGNOSIS — R69 Illness, unspecified: Secondary | ICD-10-CM | POA: Diagnosis not present

## 2021-12-12 ENCOUNTER — Other Ambulatory Visit: Payer: Self-pay

## 2021-12-12 ENCOUNTER — Ambulatory Visit (HOSPITAL_COMMUNITY)
Admission: RE | Admit: 2021-12-12 | Discharge: 2021-12-12 | Disposition: A | Discharge status: Home / Self Care | Payer: Medicare HMO | Source: Ambulatory Visit | Attending: Neurology | Admitting: Neurology

## 2021-12-12 DIAGNOSIS — M542 Cervicalgia: Secondary | ICD-10-CM | POA: Diagnosis not present

## 2021-12-12 DIAGNOSIS — M4802 Spinal stenosis, cervical region: Secondary | ICD-10-CM | POA: Diagnosis not present

## 2021-12-12 DIAGNOSIS — M2578 Osteophyte, vertebrae: Secondary | ICD-10-CM | POA: Diagnosis not present

## 2021-12-18 ENCOUNTER — Other Ambulatory Visit (INDEPENDENT_AMBULATORY_CARE_PROVIDER_SITE_OTHER): Payer: Self-pay | Admitting: Gastroenterology

## 2021-12-18 DIAGNOSIS — K5903 Drug induced constipation: Secondary | ICD-10-CM

## 2021-12-27 DIAGNOSIS — G4710 Hypersomnia, unspecified: Secondary | ICD-10-CM | POA: Diagnosis not present

## 2021-12-27 DIAGNOSIS — F419 Anxiety disorder, unspecified: Secondary | ICD-10-CM | POA: Diagnosis not present

## 2021-12-27 DIAGNOSIS — M542 Cervicalgia: Secondary | ICD-10-CM | POA: Diagnosis not present

## 2021-12-27 DIAGNOSIS — M25519 Pain in unspecified shoulder: Secondary | ICD-10-CM | POA: Diagnosis not present

## 2021-12-27 DIAGNOSIS — M722 Plantar fascial fibromatosis: Secondary | ICD-10-CM | POA: Diagnosis not present

## 2021-12-27 DIAGNOSIS — M25511 Pain in right shoulder: Secondary | ICD-10-CM | POA: Diagnosis not present

## 2021-12-27 DIAGNOSIS — F319 Bipolar disorder, unspecified: Secondary | ICD-10-CM | POA: Diagnosis not present

## 2021-12-27 DIAGNOSIS — G4721 Circadian rhythm sleep disorder, delayed sleep phase type: Secondary | ICD-10-CM | POA: Diagnosis not present

## 2021-12-27 DIAGNOSIS — Z79891 Long term (current) use of opiate analgesic: Secondary | ICD-10-CM | POA: Diagnosis not present

## 2021-12-27 DIAGNOSIS — M25512 Pain in left shoulder: Secondary | ICD-10-CM | POA: Diagnosis not present

## 2021-12-31 ENCOUNTER — Encounter (INDEPENDENT_AMBULATORY_CARE_PROVIDER_SITE_OTHER): Payer: Self-pay

## 2021-12-31 ENCOUNTER — Other Ambulatory Visit: Payer: Self-pay

## 2021-12-31 ENCOUNTER — Ambulatory Visit (INDEPENDENT_AMBULATORY_CARE_PROVIDER_SITE_OTHER): Payer: Medicare HMO | Admitting: Gastroenterology

## 2021-12-31 ENCOUNTER — Encounter (INDEPENDENT_AMBULATORY_CARE_PROVIDER_SITE_OTHER): Payer: Self-pay | Admitting: Gastroenterology

## 2021-12-31 VITALS — BP 136/89 | HR 99 | Temp 98.1°F | Ht 72.0 in | Wt 168.7 lb

## 2021-12-31 DIAGNOSIS — R1033 Periumbilical pain: Secondary | ICD-10-CM | POA: Diagnosis not present

## 2021-12-31 DIAGNOSIS — K5903 Drug induced constipation: Secondary | ICD-10-CM | POA: Diagnosis not present

## 2021-12-31 DIAGNOSIS — R109 Unspecified abdominal pain: Secondary | ICD-10-CM | POA: Insufficient documentation

## 2021-12-31 NOTE — Patient Instructions (Signed)
Increase Linzess to 145 mcg (2 pills of your current dose)  every day. Please call us in 4 weeks to update [place] if you've had any improvement with this dose. Schedule EGD and colonoscopy

## 2021-12-31 NOTE — Progress Notes (Signed)
Maylon Peppers, M.D. Gastroenterology & Hepatology [facility] [address] [place], [place] [address]  Primary Care Physician: Pcp, No No address on file  I will communicate my assessment and recommendations to the referring MD via EMR.  Problems: Opioid-induced constipation Early satiety  History of Present Illness: Alexander Douglas is a 51 y.o. male with past medical history of degenerative disc disease on opiates, depression, hyperlipidemia, plantar fasciitis who presents for evaluation of constipation and abdominal pain.  The patient was last seen on 06/21/2021. At that time, the patient was started on Linzess 72 mcg every day.  Patient reports that he has presented recurrent bloating, which usually gets worse during the day (states his abdomen is as flat as possible in the early morning). He has had decreased appetite and has forced himself to eat, but has vomited a couple of times due to this. Patient has been taking Linzess compliantly every day at the same dose. He reports that he has presented a bowel movement possibly 3 times a week. Most of the times he has a large amount of stool, it is soft in consistency.  Sometimes he has some fresh blood in stool when he has to strain.  States that he has been concerned as he presented intermittent pain in his mid abdominal area.  He is currently taking 1 Percocet every day and Subutex 2 times daily. States he has presented recurrent pain between the epigastric and mid abdominal pain after he had his umbilical hernia repaired by Dr. Rocco Serene.  The patient denies having any nausea, vomiting, fever, chills, hematochezia, melena, hematemesis, jaundice, pruritus or weight loss.  Had a CT in 02/20/2021 in [place] - found to have a moderate amount of stool.  Last KKX:[facility] Last Colonoscopy:performed in 10/2020 by Dr. 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, hypersomnia type     Past Surgical History: Past Surgical History:  Procedure Laterality Date   FOOT SURGERY     Left 8/12 right 10/12   NECK SURGERY     TOOTH EXTRACTION N/A 11/27/2021   Procedure: DENTAL RESTORATION/EXTRACTIONS;  Surgeon: Diona Browner, DMD;  Location: [facility];  Service: Oral Surgery;  Laterality: N/A;   UMBILICAL HERNIA REPAIR Bilateral    WISDOM TOOTH EXTRACTION      Family History: Family History  Problem Relation Age of Onset   Diabetes Mother    Heart disease Mother    Hyperlipidemia Mother    Hypertension Mother     Social History: Social History   Tobacco Use  Smoking Status Never  Smokeless Tobacco Former   Types: Chew   Quit date: 10/04/2021   Social History   Substance and Sexual Activity  Alcohol Use No   Alcohol/week: 0.0 standard drinks   Social History   Substance and Sexual Activity  Drug Use No    Allergies: Allergies  Allergen Reactions   Bee Venom     swelling   Seasonal Ic [Cholestatin]     Runny nose, headaches, scratching throat    Medications: Current Outpatient Medications  Medication Sig Dispense  Refill   ALPRAZolam (XANAX) 1 MG tablet Take 1 mg by mouth 2 (two) times daily.     amphetamine-dextroamphetamine (ADDERALL) 10 MG tablet Take 10 mg by mouth See admin instructions. Take 10 mg daily, may take a second 10 mg dose as needed to stay awake     buprenorphine (SUBUTEX) 8 MG SUBL SL tablet Place 8 mg under the tongue 2 (two) times daily.     buPROPion (WELLBUTRIN XL) 300 MG 24 hr tablet Take 300 mg by mouth daily.      cyclobenzaprine (FLEXERIL) 10 MG tablet Take 10 mg by mouth daily as  needed for muscle spasms.     fluticasone (FLONASE) 50 MCG/ACT nasal spray Place 1 spray into both nostrils daily as needed for allergies or rhinitis.     LINZESS 72 MCG capsule TAKE ONE CAPSULE BY MOUTH DAILY BEFORE BREAKFAST 90 capsule 3   Multiple Vitamin (MULTIVITAMIN WITH MINERALS) TABS tablet Take 1 tablet by mouth daily.     oxyCODONE-acetaminophen (PERCOCET/ROXICET) 5-325 MG tablet Take 1 tablet by mouth daily.     No current facility-administered medications for this visit.    Review of Systems: GENERAL: negative for malaise, night sweats HEENT: No changes in hearing or vision, no nose bleeds or other nasal problems. NECK: Negative for lumps, goiter, pain and significant neck swelling RESPIRATORY: Negative for cough, wheezing CARDIOVASCULAR: Negative for chest pain, leg swelling, palpitations, orthopnea GI: SEE HPI MUSCULOSKELETAL: Negative for joint pain or swelling, back pain, and muscle pain. SKIN: Negative for lesions, rash PSYCH: Negative for sleep disturbance, mood disorder and recent psychosocial stressors. HEMATOLOGY Negative for prolonged bleeding, bruising easily, and swollen nodes. ENDOCRINE: Negative for cold or heat intolerance, polyuria, polydipsia and goiter. NEURO: negative for tremor, gait imbalance, syncope and seizures. The remainder of the review of systems is noncontributory.   Physical Exam: BP 136/89 (BP Location: Left Arm, Patient Position: Sitting, Cuff Size: Large)    Pulse 99    Temp 98.1 F (36.7 C) (Oral)    Ht 6' (1.829 m)    Wt 168 lb 11.2 oz (76.5 kg)    BMI 22.88 kg/m  GENERAL: The patient is AO x3, in no acute distress. HEENT: Head is normocephalic and atraumatic. EOMI are intact. Mouth is well hydrated and without lesions. NECK: Supple. No masses LUNGS: Clear to auscultation. No presence of rhonchi/wheezing/rales. Adequate chest expansion HEART: RRR, normal s1 and s2. ABDOMEN: tender to palpation in the mid abdominal area, no guarding, no  peritoneal signs, and nondistended. BS +. No masses. EXTREMITIES: Without any cyanosis, clubbing, rash, lesions or edema. NEUROLOGIC: AOx3, no focal motor deficit. SKIN: no jaundice, no rashes  Imaging/Labs: as above  I personally reviewed and interpreted the available labs, imaging and endoscopic files.  Impression and Plan: Alexander Douglas is a 51 y.o. male with past medical history of degenerative disc disease on opiates, depression, hyperlipidemia, plantar fasciitis who presents for evaluation of constipation and abdominal pain.  The patient has presented chronic issues with constipation in the setting of chronic opiate intake.  He has presented some early satiety which is somewhat concerning but I do consider this is most likely related to the degree of constipation he has presented, as evidenced on the CT scan he had performed less than a year ago.  However, given the persistent symptoms and concerns of the patient has, we will evaluate his gastrointestinal lining with an EGD and a repeat colonoscopy (will need a 2-day prep).  We will also increase  his Linzess 145 mcg every day, and may even consider going to to 90 mcg in the future based on symptom control.  Patient understood and agreed.  - Increase Linzess to 145 mcg every day. Please call us in 4 weeks to update [place] if you've had any improvement with this dose. - Schedule EGD and colonoscopy  All questions were answered.      Harvel Quale, MD Gastroenterology and Hepatology [facility]

## 2022-01-03 ENCOUNTER — Other Ambulatory Visit (INDEPENDENT_AMBULATORY_CARE_PROVIDER_SITE_OTHER): Payer: Self-pay

## 2022-01-25 ENCOUNTER — Telehealth (INDEPENDENT_AMBULATORY_CARE_PROVIDER_SITE_OTHER): Payer: Self-pay

## 2022-01-25 ENCOUNTER — Encounter (INDEPENDENT_AMBULATORY_CARE_PROVIDER_SITE_OTHER): Payer: Self-pay

## 2022-01-25 MED ORDER — PEG 3350-KCL-NA BICARB-NACL 420 G PO SOLR: 4000.0000 mL | ORAL | 0 refills | Status: DC

## 2022-01-25 NOTE — Telephone Encounter (Signed)
Bashir Marchetti Ann Steffanie Mingle, CMA  ?

## 2022-01-30 ENCOUNTER — Encounter (HOSPITAL_COMMUNITY)
Admission: RE | Disposition: A | Discharge status: Home / Self Care | Payer: Self-pay | Source: Home / Self Care | Attending: Gastroenterology

## 2022-01-30 ENCOUNTER — Encounter (HOSPITAL_COMMUNITY): Payer: Self-pay | Admitting: Gastroenterology

## 2022-01-30 ENCOUNTER — Encounter (INDEPENDENT_AMBULATORY_CARE_PROVIDER_SITE_OTHER): Payer: Self-pay | Admitting: *Deleted

## 2022-01-30 ENCOUNTER — Ambulatory Visit (HOSPITAL_BASED_OUTPATIENT_CLINIC_OR_DEPARTMENT_OTHER): Payer: Medicare HMO | Admitting: Anesthesiology

## 2022-01-30 ENCOUNTER — Other Ambulatory Visit: Payer: Self-pay

## 2022-01-30 ENCOUNTER — Ambulatory Visit (HOSPITAL_COMMUNITY): Payer: Medicare HMO | Admitting: Anesthesiology

## 2022-01-30 ENCOUNTER — Ambulatory Visit (HOSPITAL_COMMUNITY)
Admission: RE | Admit: 2022-01-30 | Discharge: 2022-01-30 | Disposition: A | Discharge status: Home / Self Care | Payer: Medicare HMO | Attending: Gastroenterology | Admitting: Gastroenterology

## 2022-01-30 DIAGNOSIS — K317 Polyp of stomach and duodenum: Secondary | ICD-10-CM | POA: Diagnosis not present

## 2022-01-30 DIAGNOSIS — K644 Residual hemorrhoidal skin tags: Secondary | ICD-10-CM | POA: Diagnosis not present

## 2022-01-30 DIAGNOSIS — Z800 Family history of malignant neoplasm of digestive organs: Secondary | ICD-10-CM | POA: Diagnosis not present

## 2022-01-30 DIAGNOSIS — K319 Disease of stomach and duodenum, unspecified: Secondary | ICD-10-CM | POA: Insufficient documentation

## 2022-01-30 DIAGNOSIS — R1033 Periumbilical pain: Secondary | ICD-10-CM | POA: Diagnosis not present

## 2022-01-30 DIAGNOSIS — K2940 Chronic atrophic gastritis without bleeding: Secondary | ICD-10-CM | POA: Insufficient documentation

## 2022-01-30 DIAGNOSIS — F419 Anxiety disorder, unspecified: Secondary | ICD-10-CM | POA: Diagnosis not present

## 2022-01-30 DIAGNOSIS — K635 Polyp of colon: Secondary | ICD-10-CM

## 2022-01-30 DIAGNOSIS — F32A Depression, unspecified: Secondary | ICD-10-CM | POA: Diagnosis not present

## 2022-01-30 DIAGNOSIS — Z79891 Long term (current) use of opiate analgesic: Secondary | ICD-10-CM | POA: Insufficient documentation

## 2022-01-30 DIAGNOSIS — K3189 Other diseases of stomach and duodenum: Secondary | ICD-10-CM

## 2022-01-30 DIAGNOSIS — D123 Benign neoplasm of transverse colon: Secondary | ICD-10-CM | POA: Diagnosis not present

## 2022-01-30 DIAGNOSIS — R109 Unspecified abdominal pain: Secondary | ICD-10-CM

## 2022-01-30 DIAGNOSIS — K2289 Other specified disease of esophagus: Secondary | ICD-10-CM | POA: Diagnosis not present

## 2022-01-30 DIAGNOSIS — K5900 Constipation, unspecified: Secondary | ICD-10-CM

## 2022-01-30 DIAGNOSIS — K2950 Unspecified chronic gastritis without bleeding: Secondary | ICD-10-CM | POA: Diagnosis not present

## 2022-01-30 DIAGNOSIS — F418 Other specified anxiety disorders: Secondary | ICD-10-CM | POA: Diagnosis not present

## 2022-01-30 DIAGNOSIS — K31A19 Gastric intestinal metaplasia without dysplasia, unspecified site: Secondary | ICD-10-CM | POA: Insufficient documentation

## 2022-01-30 HISTORY — PX: BIOPSY: SHX5522

## 2022-01-30 HISTORY — PX: COLONOSCOPY WITH PROPOFOL: SHX5780

## 2022-01-30 HISTORY — PX: POLYPECTOMY: SHX5525

## 2022-01-30 HISTORY — PX: ESOPHAGOGASTRODUODENOSCOPY (EGD) WITH PROPOFOL: SHX5813

## 2022-01-30 LAB — HM COLONOSCOPY

## 2022-01-30 SURGERY — COLONOSCOPY WITH PROPOFOL
Anesthesia: General

## 2022-01-30 MED ORDER — PROPOFOL 10 MG/ML IV BOLUS
INTRAVENOUS | Status: DC | PRN
  Administered 2022-01-30: 100 mg via INTRAVENOUS

## 2022-01-30 MED ORDER — LINACLOTIDE 290 MCG PO CAPS: 290.0000 ug | ORAL_CAPSULE | Freq: Every day | ORAL | 3 refills | Status: DC

## 2022-01-30 MED ORDER — LACTATED RINGERS IV SOLN: INTRAVENOUS | Status: DC

## 2022-01-30 MED ORDER — PROPOFOL 500 MG/50ML IV EMUL
INTRAVENOUS | Status: DC | PRN
  Administered 2022-01-30: 150 ug/kg/min via INTRAVENOUS

## 2022-01-30 MED ORDER — LIDOCAINE 2% (20 MG/ML) 5 ML SYRINGE
INTRAMUSCULAR | Status: DC | PRN
Start: 2022-01-30 — End: 2022-01-30
  Administered 2022-01-30: 50 mg via INTRAVENOUS

## 2022-01-30 MED ORDER — OMEPRAZOLE 40 MG PO CPDR: 40.0000 mg | DELAYED_RELEASE_CAPSULE | Freq: Every day | ORAL | 0 refills | Status: DC

## 2022-01-30 NOTE — Op Note (Signed)
[facility] ?Patient Name: Alexander Douglas ?Procedure Date: 01/30/2022 8:57 AM ?MRN: [id number] ?Date of Birth: 17-Jul-1971 ?Attending MD: Maylon Peppers ,  ?CSN: [id number] ?Age: 51 ?Admit Type: Outpatient ?Procedure:                Upper GI endoscopy ?Indications:              Periumbilical abdominal pain, Family history of  ?                          gastric cancer ?Providers:                Maylon Peppers, Janeece Riggers, RN, Randa Spike,  ?                          Technician ?Referring MD:              ?Medicines:                Monitored Anesthesia Care ?Complications:            No immediate complications. ?Estimated Blood Loss:     Estimated blood loss: none. ?Procedure:                Pre-Anesthesia Assessment: ?                          - Prior to the procedure, a History and Physical  ?                          was performed, and patient medications, allergies  ?                          and sensitivities were reviewed. The patient's  ?                          tolerance of previous anesthesia was reviewed. ?                          - The risks and benefits of the procedure and the  ?                          sedation options and risks were discussed with the  ?                          patient. All questions were answered and informed  ?                          consent was obtained. ?                          - ASA Grade Assessment: II - A patient with mild  ?                          systemic disease. ?                          After obtaining informed consent, the endoscope was  ?  passed under direct vision. Throughout the  ?                          procedure, the patient's blood pressure, pulse, and  ?                          oxygen saturations were monitored continuously. The  ?                          [id number]-H190 ([id number]) scope was introduced through the  ?                          mouth, and advanced to the second part of duodenum.  ?                          The  upper GI endoscopy was accomplished without  ?                          difficulty. The patient tolerated the procedure  ?                          well. ?Scope In: 9:13:07 AM ?Scope Out: 9:26:12 AM ?Total Procedure Duration: 0 hours 13 minutes 5 seconds  ?Findings: ?     The Z-line was irregular and was found 38 cm from the incisors. ?     The examined esophagus was normal. ?     Two 4 to 10 mm sessile polyps with no bleeding were found in the gastric  ?     antrum. These polyps were removed with a hot snare. Resection and  ?     retrieval were complete. ?     Localized mildly erythematous mucosa was found in the gastric fundus.  ?     Biopsies from the body and antrum were taken with a cold forceps for  ?     Helicobacter pylori testing. ?     The examined duodenum was normal. ?Impression:               - Z-line irregular, 38 cm from the incisors. ?                          - Normal esophagus. ?                          - Two gastric polyps. Resected and retrieved. ?                          - Erythematous mucosa in the gastric fundus.  ?                          Biopsied. ?                          - Normal examined duodenum. ?Moderate Sedation: ?     Per Anesthesia Care ?Recommendation:           - Discharge patient to home (ambulatory). ?                          -  Resume previous diet. ?                          - Await pathology results. ?                          - Use Prilosec (omeprazole) 40 mg PO daily for 4  ?                          weeks. ?Procedure Code(s):        --- Professional --- ?                          [phone], Esophagogastroduodenoscopy, flexible,  ?                          transoral; with removal of tumor(s), polyp(s), or  ?                          other lesion(s) by snare technique ?                          43239, 59, Esophagogastroduodenoscopy, flexible,  ?                          transoral; with biopsy, single or multiple ?Diagnosis Code(s):        --- Professional --- ?                           K22.8, Other specified diseases of esophagus ?                          K31.7, Polyp of stomach and duodenum ?                          K31.89, Other diseases of stomach and duodenum ?                          H63.14, Periumbilical pain ?                          Z80.0, Family history of malignant neoplasm of  ?                          digestive organs ?CPT copyright 2019 American [employer] Association. All rights reserved. ?The codes documented in this report are preliminary and upon coder review may  ?be revised to meet current compliance requirements. ?Maylon Peppers, MD ?Maylon Peppers,  ?01/30/2022 9:59:21 AM ?This report has been signed electronically. ?Number of Addenda: 0 ?

## 2022-01-30 NOTE — Op Note (Signed)
[facility] ?Patient Name: Alexander Douglas ?Procedure Date: 01/30/2022 9:29 AM ?MRN: [id number] ?Date of Birth: Feb 02, 1971 ?Attending MD: Maylon Peppers ,  ?CSN: [id number] ?Age: 51 ?Admit Type: Outpatient ?Procedure:                Colonoscopy ?Indications:              Abdominal pain, Constipation ?Providers:                Maylon Peppers, Janeece Riggers, RN, Randa Spike,  ?                          Technician ?Referring MD:              ?Medicines:                Monitored Anesthesia Care ?Complications:            No immediate complications. ?Estimated Blood Loss:     Estimated blood loss: none. ?Procedure:                Pre-Anesthesia Assessment: ?                          - Prior to the procedure, a History and Physical  ?                          was performed, and patient medications, allergies  ?                          and sensitivities were reviewed. The patient's  ?                          tolerance of previous anesthesia was reviewed. ?                          - The risks and benefits of the procedure and the  ?                          sedation options and risks were discussed with the  ?                          patient. All questions were answered and informed  ?                          consent was obtained. ?                          - ASA Grade Assessment: II - A patient with mild  ?                          systemic disease. ?                          After obtaining informed consent, the colonoscope  ?                          was passed under direct vision. Throughout the  ?  procedure, the patient's blood pressure, pulse, and  ?                          oxygen saturations were monitored continuously. The  ?                          PCF-HQ190L ([id number]) scope was introduced through  ?                          the anus and advanced to the the cecum, identified  ?                          by appendiceal orifice and ileocecal valve. The  ?                           colonoscopy was performed without difficulty. The  ?                          patient tolerated the procedure well. The quality  ?                          of the bowel preparation was good. ?Scope In: 9:32:19 AM ?Scope Out: 9:58:04 AM ?Scope Withdrawal Time: 0 hours 18 minutes 38 seconds  ?Total Procedure Duration: 0 hours 25 minutes 45 seconds  ?Findings: ?     Hemorrhoids were found on perianal exam. ?     A 4 mm polyp was found in the transverse colon. The polyp was sessile.  ?     The polyp was removed with a cold snare. Resection and retrieval were  ?     complete. ?     Non-bleeding external hemorrhoids were found during retroflexion. The  ?     hemorrhoids were small. ?Impression:               - Hemorrhoids found on perianal exam. ?                          - One 4 mm polyp in the transverse colon, removed  ?                          with a cold snare. Resected and retrieved. ?                          - Non-bleeding external hemorrhoids. ?Moderate Sedation: ?     Per Anesthesia Care ?Recommendation:           - Discharge patient to home (ambulatory). ?                          - Resume previous diet. ?                          - Repeat colonoscopy for surveillance based on  ?                          pathology results. ?                          -  Await pathology results. ?                          - Increase Linzess to 290 mcg qday. ?Procedure Code(s):        --- Professional --- ?                          [phone], Colonoscopy, flexible; with removal of  ?                          tumor(s), polyp(s), or other lesion(s) by snare  ?                          technique ?Diagnosis Code(s):        --- Professional --- ?                          K64.4, Residual hemorrhoidal skin tags ?                          K63.5, Polyp of colon ?                          R10.9, Unspecified abdominal pain ?                          K59.00, Constipation, unspecified ?CPT copyright 2019 American Medical Association. All rights  reserved. ?The codes documented in this report are preliminary and upon coder review may  ?be revised to meet current compliance requirements. ?Maylon Peppers, MD ?Maylon Peppers,  ?01/30/2022 10:03:29 AM ?This report has been signed electronically. ?Number of Addenda: 0 ?

## 2022-01-30 NOTE — Anesthesia Postprocedure Evaluation (Signed)
Anesthesia Post Note ? ?Patient: Alexander Douglas ? ?Procedure(s) Performed: COLONOSCOPY WITH PROPOFOL ?ESOPHAGOGASTRODUODENOSCOPY (EGD) WITH PROPOFOL ?POLYPECTOMY ?BIOPSY ? ?Patient location during evaluation: Phase II ?Anesthesia Type: General ?Level of consciousness: awake and alert and oriented ?Pain management: pain level controlled ?Vital Signs Assessment: post-procedure vital signs reviewed and stable ?Respiratory status: spontaneous breathing, nonlabored ventilation and respiratory function stable ?Cardiovascular status: blood pressure returned to baseline and stable ?Postop Assessment: no apparent nausea or vomiting ?Anesthetic complications: no ? ? ?No notable events documented. ? ? ?Last Vitals:  ?Vitals:  ? 01/30/22 0821 01/30/22 1003  ?BP: 130/87 105/64  ?Pulse: 84 67  ?Resp: 18 18  ?Temp: 37 ?C 36.8 ?C  ?SpO2: 99% 98%  ?  ?Last Pain:  ?Vitals:  ? 01/30/22 1005  ?TempSrc:   ?PainSc: 0-No pain  ? ? ?  ?  ?  ?  ?  ?  ? ?Alexander Douglas ? ? ? ? ?

## 2022-01-30 NOTE — Anesthesia Preprocedure Evaluation (Addendum)
Anesthesia Evaluation  ?Patient identified by MRN, date of birth, ID band ?Patient awake ? ? ? ?Reviewed: ?Allergy & Precautions, NPO status , Patient's Chart, lab work & pertinent test results ? ?Airway ?Mallampati: II ? ?TM Distance: >3 FB ?Neck ROM: Full ? ? ?Comment: cervical spondylosis - occasional numbness in hands Dental ? ?(+) Dental Advisory Given, Teeth Intact ?  ?Pulmonary ?neg pulmonary ROS,  ?  ?Pulmonary exam normal ?breath sounds clear to auscultation ? ? ? ? ? ? Cardiovascular ?negative cardio ROS ?Normal cardiovascular exam ?Rhythm:Regular Rate:Normal ? ? ?  ?Neuro/Psych ? Headaches, PSYCHIATRIC DISORDERS Anxiety Depression  Neuromuscular disease (cervical spondylosis - occasional numbness in hands)   ? GI/Hepatic ?negative GI ROS, Neg liver ROS,   ?Endo/Other  ?negative endocrine ROS ? Renal/GU ?negative Renal ROS  ?negative genitourinary ?  ?Musculoskeletal ? ?(+) Arthritis , Osteoarthritis,   ? Abdominal ?  ?Peds ?negative pediatric ROS ?(+)  Hematology ?negative hematology ROS ?(+)   ?Anesthesia Other Findings ? ? Reproductive/Obstetrics ?negative OB ROS ? ?  ? ? ? ? ? ? ? ? ? ? ? ? ? ?  ?  ? ? ? ? ? ? ? ?Anesthesia Physical ?Anesthesia Plan ? ?ASA: 2 ? ?Anesthesia Plan: General  ? ?Post-op Pain Management: Minimal or no pain anticipated  ? ?Induction: Intravenous ? ?PONV Risk Score and Plan: TIVA ? ?Airway Management Planned: Nasal Cannula and Natural Airway ? ?Additional Equipment:  ? ?Intra-op Plan:  ? ?Post-operative Plan:  ? ?Informed Consent: I have reviewed the patients History and Physical, chart, labs and discussed the procedure including the risks, benefits and alternatives for the proposed anesthesia with the patient or authorized representative who has indicated his/her understanding and acceptance.  ? ? ? ?Dental advisory given ? ?Plan Discussed with: CRNA and Surgeon ? ?Anesthesia Plan Comments:   ? ? ? ? ? ? ?Anesthesia Quick Evaluation ? ?

## 2022-01-30 NOTE — H&P (Signed)
Alexander Douglas is an 51 y.o. male.   ?Chief Complaint: abdominal pain, constipation ?HPI: Alexander Douglas is a 50 y.o. male with past medical history of degenerative disc disease on opiates, depression, hyperlipidemia, plantar fasciitis who presents for evaluation of constipation and abdominal pain.  ? ?Patient reported he has presented some improvement in his constipation while taking Linzess 145 mcg every day but still is presenting some constipation intermittently.  Also presents pain in the periumbilical area usually when bending over or when pressing his abdomen.  Believes that the pain gets slightly better when he is able to defecate. ? ?Notably, he had a CT of the abdomen and pelvis with IV contrast on 02/20/2021 which showed presence of moderate stool burden throughout the colon. ? ?Past Medical History:  ?Diagnosis Date  ? Anxiety   ? Cervical disc disorder   ? Depression   ? Head injury with loss of consciousness ([place])   ? Hyperlipidemia   ? Migraine   ? Plantar fasciitis   ? Progressive anterior vertebral body fusion with somatic overgrowth   ? 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 ? ?Past Surgical History:  ?Procedure Laterality Date  ? FOOT SURGERY    ? Left 8/12 right 10/12  ? NECK SURGERY    ? TOOTH EXTRACTION N/A 11/27/2021  ? Procedure: DENTAL RESTORATION/EXTRACTIONS;  Surgeon: Diona Browner, DMD;  Location: [facility];  Service: Oral Surgery;  Laterality: N/A;  ? UMBILICAL HERNIA REPAIR Bilateral   ? WISDOM TOOTH EXTRACTION    ? ? ?Family History  ?Problem Relation Age of Onset  ? Diabetes Mother   ? Heart disease Mother   ? Hyperlipidemia Mother   ? Hypertension Mother   ? ?Social History:  reports that he has never smoked. He quit smokeless tobacco use about 3 months ago.  His smokeless tobacco use included chew. He reports that he does not drink alcohol and does not use drugs. ? ?Allergies:  ?Allergies  ?Allergen Reactions  ? Bee Venom   ?  swelling  ? Seasonal Ic  [Cholestatin]   ?  Runny nose, headaches, scratching throat  ? ? ?Medications Prior to Admission  ?Medication Sig Dispense Refill  ? ALPRAZolam (XANAX) 1 MG tablet Take 1 mg by mouth 2 (two) times daily.    ? amphetamine-dextroamphetamine (ADDERALL) 10 MG tablet Take 10 mg by mouth 2 (two) times daily as needed (Sleep Disorder). to stay awake    ? buprenorphine (SUBUTEX) 8 MG SUBL SL tablet Place 8 mg under the tongue 2 (two) times daily.    ? buPROPion (WELLBUTRIN XL) 300 MG 24 hr tablet Take 300 mg by mouth daily.     ? cyclobenzaprine (FLEXERIL) 10 MG tablet Take 10 mg by mouth daily as needed for muscle spasms.    ? fluticasone (FLONASE) 50 MCG/ACT nasal spray Place 1 spray into both nostrils daily as needed for allergies or rhinitis.    ? LINZESS 72 MCG capsule TAKE ONE CAPSULE BY MOUTH DAILY BEFORE BREAKFAST 90 capsule 3  ? Multiple Vitamin (MULTIVITAMIN WITH MINERALS) TABS tablet Take 1 tablet by mouth daily.    ? oxyCODONE-acetaminophen (PERCOCET) 7.5-325 MG tablet Take 1 tablet by mouth 2 (two) times daily.    ? polyethylene glycol-electrolytes (TRILYTE) 420 g solution Take 4,000 mLs by mouth as directed. 4000 mL 0  ? ? ?No results found for this or any previous visit (from the past 48 hour(s)). ?No results found. ? ?Review of  Systems  ?Constitutional: Negative.   ?HENT: Negative.    ?Eyes: Negative.   ?Respiratory: Negative.    ?Cardiovascular: Negative.   ?Gastrointestinal:  Positive for abdominal pain.  ?Endocrine: Negative.   ?Genitourinary: Negative.   ?Musculoskeletal: Negative.   ?Skin: Negative.   ?Allergic/Immunologic: Negative.   ?Neurological: Negative.   ?Hematological: Negative.   ?Psychiatric/Behavioral: Negative.    ? ?Blood pressure 130/87, pulse 84, temperature 98.6 ?F (37 ?C), temperature source Oral, resp. rate 18, height '5\' 11"'$  (1.803 m), weight 79.4 kg, SpO2 99 %. ?Physical Exam  ?GENERAL: The patient is AO x3, in no acute distress. ?HEENT: Head is normocephalic and atraumatic. EOMI are  intact. Mouth is well hydrated and without lesions. ?NECK: Supple. No masses ?LUNGS: Clear to auscultation. No presence of rhonchi/wheezing/rales. Adequate chest expansion ?HEART: RRR, normal s1 and s2. ?ABDOMEN: Soft, nontender, no guarding, no peritoneal signs, and nondistended. BS +. No masses. ?EXTREMITIES: Without any cyanosis, clubbing, rash, lesions or edema. ?NEUROLOGIC: AOx3, no focal motor deficit. ?SKIN: no jaundice, no rashes ? ?Assessment/Plan ? Alexander Douglas is a 51 y.o. male with past medical history of degenerative disc disease on opiates, depression, hyperlipidemia, plantar fasciitis who presents for evaluation of constipation and abdominal pain.  We will proceed with EGD and colonoscopy. ? ?Harvel Quale, MD ?01/30/2022, 8:57 AM ? ? ? ?

## 2022-01-30 NOTE — Discharge Instructions (Addendum)
You are being discharged to home.  ?Resume your previous diet.  ?We are waiting for your pathology results.  ?Take Prilosec (omeprazole) 40 mg by mouth once a day for four weeks.  ?Your physician has recommended a repeat colonoscopy for surveillance based on pathology results.  ?Increase Linzess to 290 mcg qday. ?

## 2022-01-30 NOTE — Transfer of Care (Signed)
Immediate Anesthesia Transfer of Care Note ? ?Patient: Alexander Douglas ? ?Procedure(s) Performed: COLONOSCOPY WITH PROPOFOL ?ESOPHAGOGASTRODUODENOSCOPY (EGD) WITH PROPOFOL ?POLYPECTOMY ?BIOPSY ? ?Patient Location: Endoscopy Unit ? ?Anesthesia Type:MAC ? ?Level of Consciousness: sedated and patient cooperative ? ?Airway & Oxygen Therapy: Patient Spontanous Breathing ? ?Post-op Assessment: Report given to RN, Post -op Vital signs reviewed and stable and Patient moving all extremities ? ?Post vital signs: Reviewed and stable ? ?Last Vitals:  ?Vitals Value Taken Time  ?BP    ?Temp    ?Pulse    ?Resp    ?SpO2    ? ? ?Last Pain:  ?Vitals:  ? 01/30/22 0920  ?TempSrc:   ?PainSc: 0-No pain  ?   ? ?Patients Stated Pain Goal: 6 (01/30/22 7741) ? ?Complications: No notable events documented. ?

## 2022-02-01 LAB — SURGICAL PATHOLOGY

## 2022-02-04 ENCOUNTER — Encounter (HOSPITAL_COMMUNITY): Payer: Self-pay | Admitting: Gastroenterology

## 2022-02-06 DIAGNOSIS — M542 Cervicalgia: Secondary | ICD-10-CM | POA: Diagnosis not present

## 2022-02-06 DIAGNOSIS — G4710 Hypersomnia, unspecified: Secondary | ICD-10-CM | POA: Diagnosis not present

## 2022-02-06 DIAGNOSIS — M722 Plantar fascial fibromatosis: Secondary | ICD-10-CM | POA: Diagnosis not present

## 2022-02-06 DIAGNOSIS — M79673 Pain in unspecified foot: Secondary | ICD-10-CM | POA: Diagnosis not present

## 2022-02-06 DIAGNOSIS — G4721 Circadian rhythm sleep disorder, delayed sleep phase type: Secondary | ICD-10-CM | POA: Diagnosis not present

## 2022-02-06 DIAGNOSIS — M25511 Pain in right shoulder: Secondary | ICD-10-CM | POA: Diagnosis not present

## 2022-02-06 DIAGNOSIS — Z79891 Long term (current) use of opiate analgesic: Secondary | ICD-10-CM | POA: Diagnosis not present

## 2022-03-06 DIAGNOSIS — M79673 Pain in unspecified foot: Secondary | ICD-10-CM | POA: Diagnosis not present

## 2022-03-06 DIAGNOSIS — G4710 Hypersomnia, unspecified: Secondary | ICD-10-CM | POA: Diagnosis not present

## 2022-03-06 DIAGNOSIS — M25511 Pain in right shoulder: Secondary | ICD-10-CM | POA: Diagnosis not present

## 2022-03-06 DIAGNOSIS — M722 Plantar fascial fibromatosis: Secondary | ICD-10-CM | POA: Diagnosis not present

## 2022-03-06 DIAGNOSIS — Z79891 Long term (current) use of opiate analgesic: Secondary | ICD-10-CM | POA: Diagnosis not present

## 2022-03-06 DIAGNOSIS — M542 Cervicalgia: Secondary | ICD-10-CM | POA: Diagnosis not present

## 2022-03-06 DIAGNOSIS — G4721 Circadian rhythm sleep disorder, delayed sleep phase type: Secondary | ICD-10-CM | POA: Diagnosis not present

## 2022-04-03 DIAGNOSIS — Z79891 Long term (current) use of opiate analgesic: Secondary | ICD-10-CM | POA: Diagnosis not present

## 2022-04-03 DIAGNOSIS — G4710 Hypersomnia, unspecified: Secondary | ICD-10-CM | POA: Diagnosis not present

## 2022-04-03 DIAGNOSIS — M25511 Pain in right shoulder: Secondary | ICD-10-CM | POA: Diagnosis not present

## 2022-04-03 DIAGNOSIS — M79673 Pain in unspecified foot: Secondary | ICD-10-CM | POA: Diagnosis not present

## 2022-04-03 DIAGNOSIS — M722 Plantar fascial fibromatosis: Secondary | ICD-10-CM | POA: Diagnosis not present

## 2022-04-03 DIAGNOSIS — G4721 Circadian rhythm sleep disorder, delayed sleep phase type: Secondary | ICD-10-CM | POA: Diagnosis not present

## 2022-04-03 DIAGNOSIS — M542 Cervicalgia: Secondary | ICD-10-CM | POA: Diagnosis not present

## 2022-04-04 ENCOUNTER — Ambulatory Visit (INDEPENDENT_AMBULATORY_CARE_PROVIDER_SITE_OTHER): Payer: Medicare HMO | Admitting: Gastroenterology

## 2022-04-18 ENCOUNTER — Telehealth (INDEPENDENT_AMBULATORY_CARE_PROVIDER_SITE_OTHER): Payer: Medicare HMO | Admitting: Gastroenterology

## 2022-04-18 ENCOUNTER — Encounter (INDEPENDENT_AMBULATORY_CARE_PROVIDER_SITE_OTHER): Payer: Self-pay | Admitting: Gastroenterology

## 2022-04-18 VITALS — Ht 71.0 in | Wt 178.0 lb

## 2022-04-18 DIAGNOSIS — K5903 Drug induced constipation: Secondary | ICD-10-CM | POA: Diagnosis not present

## 2022-04-18 DIAGNOSIS — K581 Irritable bowel syndrome with constipation: Secondary | ICD-10-CM | POA: Diagnosis not present

## 2022-04-18 DIAGNOSIS — K589 Irritable bowel syndrome without diarrhea: Secondary | ICD-10-CM | POA: Insufficient documentation

## 2022-04-18 MED ORDER — LINACLOTIDE 72 MCG PO CAPS: 145.0000 ug | ORAL_CAPSULE | Freq: Every day | ORAL | 3 refills | Status: DC

## 2022-04-18 NOTE — Progress Notes (Signed)
Alexander Douglas, M.D. Gastroenterology & Hepatology [facility] [address] [place], [place] [address] Primary Care Physician: [employer], [name]  This is a virtual video visit.  It required patient-provider interaction for the medical decision making as documented below. The patient has consented and agreed to proceed with a Telehealth.  VIRTUAL VISIT NOTE Patient location: Home Provider location: Office  I will communicate my assessment and recommendations to the referring MD via EMR. Note: Occasional unusual wording and randomly placed punctuation marks may result from the use of speech recognition technology to transcribe this document"  Chief Complaint: Constipation, abdominal bloating and pain  History of Present Illness: Alexander Douglas is a 51 y.o. male with past medical history of degenerative disc disease on opiates, depression, hyperlipidemia, plantar fasciitis, who presents for follow-up of constipation, abdominal pain and bloating.  The patient was last seen on 12/31/2021.  He was scheduled to undergo an EGD and a colonoscopy.  He was advised to increase his Linzess to 145 mcg every day.  EGD and colonoscopy with findings described below.  Patient reports that he eventually increase the Linzess to 290 mcg dosing.  He has felt the Linzess 290 micrograms "may be too strong" and causes significant diarrhea - he would have urgency frequently. Most of the times he is taking two 72 mcg pills every day, but sometimes takes 1 pill every day. Bloating has much more improved, usually after he moves his bowels.  Has a BM every day.  States that when he has a BM his stool has a "greasy smell like [employer]". The stool looks thick "similar to mud". No diarrhea unless he takes the high dose Linzess.  States that his main concern is that he has had decreased appetite but has not lost any weight.  Believes buprenorphine has led to most of his symptoms but does not take care of back pain.  Occasionally has some sharp discomfort in the periumbilical area but this is infrequent and has been less severe since he has been on Linzess.  CT A/P w/o contrast at [facility] showed stool throughout the colon. Also a 3 mm subpleural nodule.  The patient denies having any nausea, vomiting, fever, chills, hematochezia, melena, hematemesis, jaundice, pruritus or weight loss.  Last EGD:01/30/2022 - Z-line irregular, 38 cm from the incisors. - Normal esophagus. - Two gastric polyps. Resected and retrieved. - Erythematous mucosa in the gastric fundus. Biopsied. - Normal examined duodenum.  Last Colonoscopy:01/30/22 Hemorrhoids were found on perianal exam. A 4 mm polyp was found in the transverse colon. The polyp was sessile. The polyp was removed with a cold snare. Resection and retrieval were complete. Non-bleeding external hemorrhoids were found during retroflexion. The hemorrhoids were small.  Recommended repeat in 7 years.  Path: A. STOMACH, POLYPECTOMY:  - Gastric hyperplastic polyp  - Negative for intestinal metaplasia or dysplasia  - No H. pylori  B. STOMACH, BIOPSY:  - Gastric oxyntic mucosa with chronic gastritis with atrophy and focal  intestinal metaplasia, see comment  - Negative for dysplasia  - Gastric antral mucosa with mild nonspecific reactive gastropathy   C. 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, hypersomnia type     Past Surgical History: Past Surgical  History:  Procedure Laterality Date   BIOPSY  01/30/2022   Procedure: BIOPSY;  Surgeon: Harvel Quale, MD;  Location[facility];  Service: Gastroenterology;;   COLONOSCOPY WITH PROPOFOL N/A 01/30/2022   Procedure: COLONOSCOPY WITH PROPOFOL;  Surgeon: Harvel Quale, MD;  Location: [facility];  Service: Gastroenterology;  Laterality: N/A;  915   ESOPHAGOGASTRODUODENOSCOPY (EGD) WITH PROPOFOL N/A 01/30/2022   Procedure: ESOPHAGOGASTRODUODENOSCOPY (EGD) WITH PROPOFOL;  Surgeon: Harvel Quale, MD;  Location: [facility];  Service: Gastroenterology;  Laterality: N/A;   FOOT SURGERY     Left 8/12 right 10/12   NECK SURGERY     POLYPECTOMY  01/30/2022   Procedure: POLYPECTOMY;  Surgeon: Harvel Quale, MD;  Location: [facility];  Service: Gastroenterology;;   TOOTH EXTRACTION N/A 11/27/2021   Procedure: DENTAL RESTORATION/EXTRACTIONS;  Surgeon: Diona Browner, DMD;  Location: [place];  Service: Oral Surgery;  Laterality: N/A;   UMBILICAL HERNIA REPAIR Bilateral    WISDOM TOOTH EXTRACTION      Family History: Family History  Problem Relation Age of Onset   Diabetes Mother    Heart disease Mother    Hyperlipidemia Mother    Hypertension Mother     Social History: Social History   Tobacco Use  Smoking Status Never  Smokeless Tobacco Former   Types: Chew   Quit date: 10/04/2021   Social History   Substance and Sexual Activity  Alcohol Use No   Alcohol/week: 0.0 standard drinks of alcohol   Social History   Substance and Sexual Activity  Drug Use No    Allergies: Allergies  Allergen Reactions   Bee Venom     swelling   Seasonal Ic [Cholestatin]     Runny nose, headaches, scratching throat    Medications: Current Outpatient Medications  Medication Sig Dispense Refill   ALPRAZolam (XANAX) 1 MG tablet Take 1 mg by mouth 2 (two) times daily.     amphetamine-dextroamphetamine (ADDERALL) 10 MG tablet Take 10 mg by mouth  daily with breakfast. to stay awake     buprenorphine (SUBUTEX) 8 MG SUBL SL tablet Place 8 mg under the tongue 2 (two) times daily.     buPROPion (WELLBUTRIN XL) 300 MG 24 hr tablet Take 300 mg by mouth daily.      cyclobenzaprine (FLEXERIL) 10 MG tablet Take 10 mg by mouth daily as needed for muscle spasms.     fluticasone (FLONASE) 50 MCG/ACT nasal spray Place 1 spray into both nostrils daily as needed for allergies or rhinitis.     linaclotide (LINZESS) 72 MCG capsule Take 72 mcg by mouth daily before breakfast. 2 daily.     Multiple Vitamin (MULTIVITAMIN WITH MINERALS) TABS tablet Take 1 tablet by mouth daily.     omeprazole (PRILOSEC) 40 MG capsule Take 1 capsule (40 mg total) by mouth daily. 30 capsule 0   No current facility-administered medications for this visit.    Review of Systems: GENERAL: negative for malaise, night sweats HEENT: No changes in hearing or vision, no nose bleeds or other nasal problems. NECK: Negative for lumps, goiter, pain and significant neck swelling RESPIRATORY: Negative for cough, wheezing CARDIOVASCULAR: Negative for chest pain, leg swelling, palpitations, orthopnea GI: SEE HPI MUSCULOSKELETAL: Negative for joint pain or  swelling, back pain, and muscle pain. SKIN: Negative for lesions, rash PSYCH: Negative for sleep disturbance, mood disorder and recent psychosocial stressors. HEMATOLOGY Negative for prolonged bleeding, bruising easily, and swollen nodes. ENDOCRINE: Negative for cold or heat intolerance, polyuria, polydipsia and goiter. NEURO: negative for tremor, gait imbalance, syncope and seizures. The remainder of the review of systems is noncontributory.   Physical Exam: GENERAL: The patient is AO x3, in no acute distress. HEENT: Head is normocephalic and atraumatic. EOMI are intact. Mouth is well hydrated and without lesions. LUNGS: Adequate chest expansion. Auscultation could not be performed remotely. HEART: Auscultation could not be  performed remotely. ABDOMEN: Nondistended. No masses were observed. EXTREMITIES: Without any cyanosis, clubbing, rash, lesions or edema. NEUROLOGIC: AOx3, no focal motor deficit. SKIN: no jaundice, no rashes  Imaging/Labs: as above  I personally reviewed and interpreted the available labs, imaging and endoscopic files.  Impression and Plan: CHAZZ PHILSON is a 51 y.o. male with past medical history of degenerative disc disease on opiates, depression, hyperlipidemia, plantar fasciitis, who presents for follow-up of constipation, abdominal pain and bloating.  The patient reports having improvement with use Alymsys for his constipation which is likely related to his chronic opiate use.  He has presented improvement of his abdominal discomfort and chronic abdominal complaints, which may be explained by the presence of bowel hypersensitivity exacerbated by the use of opiates.  As he has presented better symptom control with less side effects on the 145 mcg dosing, we will continue with Linzess 145 mcg every day but he can take 72 mcg occasionally as he has tried this on some occasions.  Reassuringly, he had negative endoscopic and imaging investigations in the past and has not presented any weight loss despite having poor appetite.  As these symptoms could be related to his medications, he will discuss with his PCP the possibility of switching to another medicine.  - Continue Linzess 72 mcg 1-2 pills every day - Discuss with PCP the possibility of tapering or decreasing buprenorphine  All questions were answered.      Face-to-face time: I spent a total of  35 minutes  Alexander Peppers, MD Gastroenterology and Hepatology [facility]

## 2022-04-18 NOTE — Patient Instructions (Signed)
Continue Linzess 72 mcg 1-2 pills every day Discuss with PCP the possibility of tapering or decreasing buprenorphine

## 2022-04-19 ENCOUNTER — Other Ambulatory Visit (INDEPENDENT_AMBULATORY_CARE_PROVIDER_SITE_OTHER): Payer: Self-pay | Admitting: *Deleted

## 2022-04-19 ENCOUNTER — Other Ambulatory Visit (INDEPENDENT_AMBULATORY_CARE_PROVIDER_SITE_OTHER): Payer: Self-pay | Admitting: Gastroenterology

## 2022-04-19 ENCOUNTER — Telehealth (INDEPENDENT_AMBULATORY_CARE_PROVIDER_SITE_OTHER): Payer: Self-pay | Admitting: *Deleted

## 2022-04-19 DIAGNOSIS — K581 Irritable bowel syndrome with constipation: Secondary | ICD-10-CM

## 2022-04-19 DIAGNOSIS — K5903 Drug induced constipation: Secondary | ICD-10-CM

## 2022-04-19 MED ORDER — LINACLOTIDE 72 MCG PO CAPS: 145.0000 ug | ORAL_CAPSULE | Freq: Every day | ORAL | 11 refills | Status: DC

## 2022-04-19 MED ORDER — LINACLOTIDE 145 MCG PO CAPS: 145.0000 ug | ORAL_CAPSULE | Freq: Every day | ORAL | 3 refills | Status: DC

## 2022-04-19 NOTE — Telephone Encounter (Signed)
Fax from [facility] states linzess 41mg required PA. I went through cover my meds and it says no PA required for linzess. It was sent in for 90 day supply. And they only cover a 30 day supply. I changed RX to 30 day supply and called the pharmacy to see if it would go through and it still not going through. Insurance will only cover 30 tablets per 30 days and he is taking two of the 72 mcg daily.  Pharmacy suggested prescribing 1426m once daily and it should go through that way.

## 2022-04-19 NOTE — Telephone Encounter (Signed)
I sent a prescription for the 145 mcg, please let the patient know we can only send the 145 dosing as the other 72 pills are not covered

## 2022-04-22 NOTE — Telephone Encounter (Signed)
Left message to return call 

## 2022-04-26 NOTE — Telephone Encounter (Signed)
Left message to return call 

## 2022-04-29 ENCOUNTER — Other Ambulatory Visit (INDEPENDENT_AMBULATORY_CARE_PROVIDER_SITE_OTHER): Payer: Self-pay | Admitting: Gastroenterology

## 2022-04-29 DIAGNOSIS — K581 Irritable bowel syndrome with constipation: Secondary | ICD-10-CM

## 2022-04-29 MED ORDER — LINACLOTIDE 72 MCG PO CAPS: 72.0000 ug | ORAL_CAPSULE | Freq: Every day | ORAL | 3 refills | Status: AC

## 2022-04-29 NOTE — Telephone Encounter (Signed)
72 mcg dosing sent to pharmacy

## 2022-04-29 NOTE — Telephone Encounter (Signed)
Left message to return call 

## 2022-04-29 NOTE — Telephone Encounter (Signed)
Patient called back and I discussed with him. He states he was taking linzess 72 mcg one bid because when he took 2 at the same time he would have diarrhea. He states he knows he cant do the 145 mcg daily and would rather just go back to 72 mcg one daily instead of one bid.

## 2022-04-29 NOTE — Telephone Encounter (Signed)
Patient notified new rx for linzess 72 mcg take one daily has been sent to pharm. and I called pharm to cancel rx that was sent for 145 mcg 10 days ago.

## 2022-05-01 DIAGNOSIS — M79673 Pain in unspecified foot: Secondary | ICD-10-CM | POA: Diagnosis not present

## 2022-05-01 DIAGNOSIS — M25511 Pain in right shoulder: Secondary | ICD-10-CM | POA: Diagnosis not present

## 2022-05-01 DIAGNOSIS — M542 Cervicalgia: Secondary | ICD-10-CM | POA: Diagnosis not present

## 2022-05-01 DIAGNOSIS — Z79891 Long term (current) use of opiate analgesic: Secondary | ICD-10-CM | POA: Diagnosis not present

## 2022-05-01 DIAGNOSIS — M722 Plantar fascial fibromatosis: Secondary | ICD-10-CM | POA: Diagnosis not present

## 2022-05-01 DIAGNOSIS — G4721 Circadian rhythm sleep disorder, delayed sleep phase type: Secondary | ICD-10-CM | POA: Diagnosis not present

## 2022-05-27 DIAGNOSIS — M4802 Spinal stenosis, cervical region: Secondary | ICD-10-CM | POA: Diagnosis not present

## 2022-05-27 DIAGNOSIS — M5030 Other cervical disc degeneration, unspecified cervical region: Secondary | ICD-10-CM | POA: Diagnosis not present

## 2022-05-27 DIAGNOSIS — Z0000 Encounter for general adult medical examination without abnormal findings: Secondary | ICD-10-CM | POA: Diagnosis not present

## 2022-05-27 DIAGNOSIS — M47812 Spondylosis without myelopathy or radiculopathy, cervical region: Secondary | ICD-10-CM | POA: Diagnosis not present

## 2022-05-29 DIAGNOSIS — Z79891 Long term (current) use of opiate analgesic: Secondary | ICD-10-CM | POA: Diagnosis not present

## 2022-05-29 DIAGNOSIS — G4721 Circadian rhythm sleep disorder, delayed sleep phase type: Secondary | ICD-10-CM | POA: Diagnosis not present

## 2022-05-29 DIAGNOSIS — M25511 Pain in right shoulder: Secondary | ICD-10-CM | POA: Diagnosis not present

## 2022-05-29 DIAGNOSIS — G4710 Hypersomnia, unspecified: Secondary | ICD-10-CM | POA: Diagnosis not present

## 2022-05-29 DIAGNOSIS — M542 Cervicalgia: Secondary | ICD-10-CM | POA: Diagnosis not present

## 2022-05-29 DIAGNOSIS — M722 Plantar fascial fibromatosis: Secondary | ICD-10-CM | POA: Diagnosis not present

## 2022-05-29 DIAGNOSIS — M79673 Pain in unspecified foot: Secondary | ICD-10-CM | POA: Diagnosis not present

## 2022-06-19 DIAGNOSIS — Z20828 Contact with and (suspected) exposure to other viral communicable diseases: Secondary | ICD-10-CM | POA: Diagnosis not present

## 2022-06-19 DIAGNOSIS — Z13220 Encounter for screening for lipoid disorders: Secondary | ICD-10-CM | POA: Diagnosis not present

## 2022-06-19 DIAGNOSIS — R140 Abdominal distension (gaseous): Secondary | ICD-10-CM | POA: Diagnosis not present

## 2022-06-19 DIAGNOSIS — M47812 Spondylosis without myelopathy or radiculopathy, cervical region: Secondary | ICD-10-CM | POA: Diagnosis not present

## 2022-06-19 DIAGNOSIS — Z0000 Encounter for general adult medical examination without abnormal findings: Secondary | ICD-10-CM | POA: Diagnosis not present

## 2022-06-19 DIAGNOSIS — R609 Edema, unspecified: Secondary | ICD-10-CM | POA: Diagnosis not present

## 2022-07-13 ENCOUNTER — Other Ambulatory Visit: Payer: Self-pay

## 2022-07-13 ENCOUNTER — Emergency Department (HOSPITAL_COMMUNITY): Payer: Medicare HMO

## 2022-07-13 ENCOUNTER — Encounter (HOSPITAL_COMMUNITY): Payer: Self-pay

## 2022-07-13 ENCOUNTER — Emergency Department (HOSPITAL_COMMUNITY)
Admission: EM | Admit: 2022-07-13 | Discharge: 2022-07-13 | Disposition: A | Discharge status: Home / Self Care | Payer: Medicare HMO | Attending: Emergency Medicine | Admitting: Emergency Medicine

## 2022-07-13 DIAGNOSIS — R2241 Localized swelling, mass and lump, right lower limb: Secondary | ICD-10-CM | POA: Diagnosis not present

## 2022-07-13 DIAGNOSIS — M79671 Pain in right foot: Secondary | ICD-10-CM | POA: Diagnosis not present

## 2022-07-13 DIAGNOSIS — M7989 Other specified soft tissue disorders: Secondary | ICD-10-CM | POA: Diagnosis not present

## 2022-07-13 LAB — CBC WITH DIFFERENTIAL/PLATELET
Abs Immature Granulocytes: 0 10*3/uL (ref 0.00–0.07)
Basophils Absolute: 0 10*3/uL (ref 0.0–0.1)
Basophils Relative: 1 %
Eosinophils Absolute: 0.3 10*3/uL (ref 0.0–0.5)
Eosinophils Relative: 5 %
HCT: 35.2 % — ABNORMAL LOW (ref 39.0–52.0)
Hemoglobin: 12.1 g/dL — ABNORMAL LOW (ref 13.0–17.0)
Immature Granulocytes: 0 %
Lymphocytes Relative: 31 %
Lymphs Abs: 1.7 10*3/uL (ref 0.7–4.0)
MCH: 32.4 pg (ref 26.0–34.0)
MCHC: 34.4 g/dL (ref 30.0–36.0)
MCV: 94.1 fL (ref 80.0–100.0)
Monocytes Absolute: 0.3 10*3/uL (ref 0.1–1.0)
Monocytes Relative: 5 %
Neutro Abs: 3.3 10*3/uL (ref 1.7–7.7)
Neutrophils Relative %: 58 %
Platelets: 195 10*3/uL (ref 150–400)
RBC: 3.74 MIL/uL — ABNORMAL LOW (ref 4.22–5.81)
RDW: 12.8 % (ref 11.5–15.5)
WBC: 5.6 10*3/uL (ref 4.0–10.5)
nRBC: 0 % (ref 0.0–0.2)

## 2022-07-13 LAB — BASIC METABOLIC PANEL
Anion gap: 8 (ref 5–15)
BUN: 13 mg/dL (ref 6–20)
CO2: 28 mmol/L (ref 22–32)
Calcium: 8.9 mg/dL (ref 8.9–10.3)
Chloride: 106 mmol/L (ref 98–111)
Creatinine, Ser: 0.88 mg/dL (ref 0.61–1.24)
GFR, Estimated: >60 mL/min (ref >60)
Glucose, Bld: 101 mg/dL — ABNORMAL HIGH (ref 70–99)
Potassium: 4.3 mmol/L (ref 3.5–5.1)
Sodium: 142 mmol/L (ref 135–145)

## 2022-07-13 LAB — HEPATIC FUNCTION PANEL
ALT: 16 U/L (ref 0–44)
AST: 18 U/L (ref 15–41)
Albumin: 4.1 g/dL (ref 3.5–5.0)
Alkaline Phosphatase: 47 U/L (ref 38–126)
Bilirubin, Direct: 0.1 mg/dL (ref 0.0–0.2)
Indirect Bilirubin: 0.4 mg/dL (ref 0.3–0.9)
Total Bilirubin: 0.5 mg/dL (ref 0.3–1.2)
Total Protein: 7.1 g/dL (ref 6.5–8.1)

## 2022-07-13 NOTE — ED Triage Notes (Signed)
Patient reports swelling to right foot/ankle intermittently for the past year. States that he has been to the doctor several times for this but the swelling has always gone down by the time he gets there. States that he feels like it is colder than other foot at times. Has been swollen for four days this time. States that he feels tightness in calf. Reports stomach distention with pain. States this is chronic as well. Started after hernia surgery.

## 2022-07-13 NOTE — Discharge Instructions (Signed)
You have been scheduled to return here for ultrasound of your right leg.  Please call the scheduling number listed to schedule an appointment time.  Please follow-up with your primary care provider for recheck.

## 2022-07-16 NOTE — ED Provider Notes (Signed)
[facility] EMERGENCY DEPARTMENT Provider Note   CSN: [id number] Arrival date & time: 07/13/22  1017     History  Chief Complaint  Patient presents with   Foot Pain    Alexander Douglas is a 51 y.o. male.   Foot Pain Pertinent negatives include no chest pain.       Alexander Douglas is a 51 y.o. male who presents to the Emergency Department complaining of right lower leg and foot swelling x one year.  States this is a recurrent problem for him.  He describes intermittent swelling of his right foot that extends to the level of uppermost lower leg.  Unsure of any provoking factors.  Spontaneously resolves.  He also state that at times his foot feels cold.  He states he has had several ultrasounds of his leg in the past but never during the time it has been swollen.  No history of venous or arterial insuffiencey or DVT,  denies pain of his leg, numbness, weakness, redness of the extremity, chest pain or shortness of breath  Home Medications Prior to Admission medications   Medication Sig Start Date End Date Taking? Authorizing Provider  ALPRAZolam Duanne Moron) 1 MG tablet Take 1 mg by mouth 2 (two) times daily.   Yes [provider]  amphetamine-dextroamphetamine (ADDERALL) 10 MG tablet Take 10 mg by mouth daily with breakfast. to stay awake 09/30/20  Yes [provider]  atorvastatin (LIPITOR) 20 MG tablet Take 20 mg by mouth daily. 06/28/22  Yes [provider]  buprenorphine (SUBUTEX) 8 MG SUBL SL tablet Place 8 mg under the tongue 2 (two) times daily. 09/30/20  Yes [provider]  buPROPion (WELLBUTRIN XL) 300 MG 24 hr tablet Take 300 mg by mouth daily.  08/19/13  Yes [provider]  cyclobenzaprine (FLEXERIL) 10 MG tablet Take 10 mg by mouth daily as needed for muscle spasms.   Yes [provider]  linaclotide Rolan Lipa) 72 MCG capsule Take 1 capsule (72 mcg total) by mouth daily before breakfast. 04/29/22  Yes Montez Morita,  Quillian Quince, MD  Multiple Vitamin (MULTIVITAMIN WITH MINERALS) TABS tablet Take 1 tablet by mouth daily.   Yes [provider]  oxyCODONE (OXY IR/ROXICODONE) 5 MG immediate release tablet Take 5 mg by mouth daily as needed for pain. 07/09/22  Yes [provider]      Allergies    Bee venom and Seasonal ic [cholestatin]    Review of Systems   Review of Systems  Constitutional:  Negative for chills and fever.  Cardiovascular:  Positive for leg swelling. Negative for chest pain and palpitations.  Gastrointestinal:  Negative for diarrhea, nausea and vomiting.  Musculoskeletal:  Negative for arthralgias, myalgias and neck pain.  Skin:  Negative for color change, pallor and wound.  Neurological:  Negative for dizziness, weakness and numbness.    Physical Exam Updated Vital Signs BP (!) 135/104 (BP Location: Left Arm)   Pulse 76   Temp 98 F (36.7 C)   Resp 18   Ht '5\' 11"'$  (1.803 m)   Wt 81.6 kg   SpO2 98%   BMI 25.10 kg/m  Physical Exam Vitals and nursing note reviewed.  Constitutional:      General: He is not in acute distress.    Appearance: Normal appearance. He is not toxic-appearing.  Cardiovascular:     Rate and Rhythm: Normal rate and regular rhythm.     Pulses: Normal pulses.  Pulmonary:     Effort: Pulmonary effort  is normal. No respiratory distress.  Abdominal:     General: There is no distension.     Palpations: Abdomen is soft.     Tenderness: There is no abdominal tenderness. There is no right CVA tenderness or left CVA tenderness.  Musculoskeletal:        General: Swelling present. No tenderness or signs of injury.     Right lower leg: Edema present.     Left lower leg: No edema.     Comments: Unilateral edema of the RLE from dorsal foot to mid calf.  Extremity is warm and pink.  Good cap refill of the toes, no wounds, rash, erythema, or posterior tenderness  Skin:    Capillary Refill: Capillary refill takes less than 2 seconds.  Neurological:      General: No focal deficit present.     Mental Status: He is alert.     Sensory: No sensory deficit.     Motor: No weakness.     ED Results / Procedures / Treatments   Labs (all labs ordered are listed, but only abnormal results are displayed) Labs Reviewed  CBC WITH DIFFERENTIAL/PLATELET - Abnormal; Notable for the following components:      Result Value   RBC 3.74 (*)    Hemoglobin 12.1 (*)    HCT 35.2 (*)    All other components within normal limits  BASIC METABOLIC PANEL - Abnormal; Notable for the following components:   Glucose, Bld 101 (*)    All other components within normal limits  HEPATIC FUNCTION PANEL    EKG None  Radiology No results found.  Procedures Procedures    Medications Ordered in ED Medications - No data to display  ED Course/ Medical Decision Making/ A&P                           Medical Decision Making Pt here with chronic, intermittent swelling of the right LE x 1 year,  notes swelling for 4 days.  Denies redness, injury, or pain to the leg or foot.  No prior PE or DVT.  No hx of venous or arterial insuffiency.    On exam he has unilateral pitting edema of the dorsal foot to mid calf.  Good cap refill and the extremity is warm and pink.  No palpable cord, Homan;s sign negative and the extremity is non tender.  Diff dx would include but not limited to DVT, PAD, venous insuffiency, ischemia of the leg, cellulitis.  Amount and/or Complexity of Data Reviewed Labs: ordered.    Details: Labs show one point drop in hemoglobin from 7 months ago, otherwise labs unremarkable.  Radiology: ordered.    Details: XR of the tib fib w/o evidence of acute bony findings Discussion of management or test interpretation with external provider(s): Source of pt's intermittent edema unclear.  Unfortunately, I do not have [place] availability here on weekends, so I will have pt return for scheduled [place].  My clinical suspicion for DVT is low given previous US studies have  been negative and exam findings today do not strongly suggest a clot.   Pt agreeable to plan.  He will f/u with PCP after [place].  Return precautions discussed.            Final Clinical Impression(s) / ED Diagnoses Final diagnoses:  Swelling of right lower extremity    Rx / DC Orders ED Discharge Orders          Ordered  US Venous Img Lower Unilateral Right        07/13/22 1317              Kem Parkinson, PA-C 07/16/22 2227    Fredia Sorrow, MD 07/28/22 0002

## 2022-07-17 ENCOUNTER — Ambulatory Visit (HOSPITAL_COMMUNITY)
Admission: RE | Admit: 2022-07-17 | Discharge: 2022-07-17 | Disposition: A | Discharge status: Home / Self Care | Payer: Medicare HMO | Source: Ambulatory Visit | Attending: Emergency Medicine | Admitting: Emergency Medicine

## 2022-07-17 DIAGNOSIS — M7989 Other specified soft tissue disorders: Secondary | ICD-10-CM | POA: Insufficient documentation

## 2022-07-17 DIAGNOSIS — R609 Edema, unspecified: Secondary | ICD-10-CM | POA: Diagnosis not present

## 2022-07-17 NOTE — ED Provider Notes (Signed)
This is an overall well-appearing 51 year old male who presents for the results of an ultrasound of his right lower extremity to rule out DVT.  I independently interpreted imaging including [place] of RLE which shows no evidence of acute DVT or other abnormality. I agree with the radiologist interpretation. Patient informed of results.  Suspect asymmetric venous insufficiency vs other non emergent soft tissue abnormality. Patient to follow  up with PCP per plan in [facility], PA-C note from yesterday. Patient understands and agrees to plan.    Dorien Chihuahua 07/17/22 1551    Noemi Chapel, MD 07/27/22 2150

## 2022-07-24 DIAGNOSIS — M722 Plantar fascial fibromatosis: Secondary | ICD-10-CM | POA: Diagnosis not present

## 2022-07-24 DIAGNOSIS — M25511 Pain in right shoulder: Secondary | ICD-10-CM | POA: Diagnosis not present

## 2022-07-24 DIAGNOSIS — G4710 Hypersomnia, unspecified: Secondary | ICD-10-CM | POA: Diagnosis not present

## 2022-07-24 DIAGNOSIS — M79673 Pain in unspecified foot: Secondary | ICD-10-CM | POA: Diagnosis not present

## 2022-07-24 DIAGNOSIS — M542 Cervicalgia: Secondary | ICD-10-CM | POA: Diagnosis not present

## 2022-07-24 DIAGNOSIS — G4721 Circadian rhythm sleep disorder, delayed sleep phase type: Secondary | ICD-10-CM | POA: Diagnosis not present

## 2022-07-24 DIAGNOSIS — Z79891 Long term (current) use of opiate analgesic: Secondary | ICD-10-CM | POA: Diagnosis not present

## 2022-08-21 DIAGNOSIS — G4721 Circadian rhythm sleep disorder, delayed sleep phase type: Secondary | ICD-10-CM | POA: Diagnosis not present

## 2022-08-21 DIAGNOSIS — M25511 Pain in right shoulder: Secondary | ICD-10-CM | POA: Diagnosis not present

## 2022-08-21 DIAGNOSIS — M722 Plantar fascial fibromatosis: Secondary | ICD-10-CM | POA: Diagnosis not present

## 2022-08-21 DIAGNOSIS — Z79891 Long term (current) use of opiate analgesic: Secondary | ICD-10-CM | POA: Diagnosis not present

## 2022-08-21 DIAGNOSIS — M542 Cervicalgia: Secondary | ICD-10-CM | POA: Diagnosis not present

## 2022-08-21 DIAGNOSIS — M79673 Pain in unspecified foot: Secondary | ICD-10-CM | POA: Diagnosis not present

## 2022-08-21 DIAGNOSIS — G4710 Hypersomnia, unspecified: Secondary | ICD-10-CM | POA: Diagnosis not present

## 2022-08-26 ENCOUNTER — Ambulatory Visit (INDEPENDENT_AMBULATORY_CARE_PROVIDER_SITE_OTHER): Payer: Medicare HMO | Admitting: Clinical

## 2022-08-26 ENCOUNTER — Encounter (HOSPITAL_COMMUNITY): Payer: Self-pay

## 2022-08-26 DIAGNOSIS — F331 Major depressive disorder, recurrent, moderate: Secondary | ICD-10-CM | POA: Diagnosis not present

## 2022-08-26 DIAGNOSIS — F419 Anxiety disorder, unspecified: Secondary | ICD-10-CM
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
## 2022-08-26 NOTE — Plan of Care (Signed)
Verbal Consent 

## 2022-09-03 DIAGNOSIS — M4802 Spinal stenosis, cervical region: Secondary | ICD-10-CM | POA: Diagnosis not present

## 2022-09-03 DIAGNOSIS — M5010 Cervical disc disorder with radiculopathy, unspecified cervical region: Secondary | ICD-10-CM | POA: Diagnosis not present

## 2022-09-03 DIAGNOSIS — M4803 Spinal stenosis, cervicothoracic region: Secondary | ICD-10-CM | POA: Diagnosis not present

## 2022-09-03 DIAGNOSIS — M5013 Cervical disc disorder with radiculopathy, cervicothoracic region: Secondary | ICD-10-CM | POA: Diagnosis not present

## 2022-09-03 DIAGNOSIS — Z79899 Other long term (current) drug therapy: Secondary | ICD-10-CM | POA: Diagnosis not present

## 2022-09-03 DIAGNOSIS — Z888 Allergy status to other drugs, medicaments and biological substances status: Secondary | ICD-10-CM | POA: Diagnosis not present

## 2022-09-17 ENCOUNTER — Ambulatory Visit (INDEPENDENT_AMBULATORY_CARE_PROVIDER_SITE_OTHER): Payer: Medicare HMO | Admitting: Clinical

## 2022-09-17 DIAGNOSIS — F331 Major depressive disorder, recurrent, moderate: Secondary | ICD-10-CM | POA: Diagnosis not present

## 2022-09-17 DIAGNOSIS — F419 Anxiety disorder, unspecified: Secondary | ICD-10-CM | POA: Diagnosis not present
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
## 2022-10-17 ENCOUNTER — Telehealth (HOSPITAL_COMMUNITY): Payer: Self-pay

## 2022-10-17 DIAGNOSIS — M25511 Pain in right shoulder: Secondary | ICD-10-CM | POA: Diagnosis not present

## 2022-10-17 DIAGNOSIS — M542 Cervicalgia: Secondary | ICD-10-CM | POA: Diagnosis not present

## 2022-10-17 DIAGNOSIS — Z79891 Long term (current) use of opiate analgesic: Secondary | ICD-10-CM | POA: Diagnosis not present

## 2022-10-17 DIAGNOSIS — M79673 Pain in unspecified foot: Secondary | ICD-10-CM | POA: Diagnosis not present

## 2022-10-17 DIAGNOSIS — M722 Plantar fascial fibromatosis: Secondary | ICD-10-CM | POA: Diagnosis not present

## 2022-10-17 DIAGNOSIS — G4721 Circadian rhythm sleep disorder, delayed sleep phase type: Secondary | ICD-10-CM | POA: Diagnosis not present

## 2022-10-17 DIAGNOSIS — G4710 Hypersomnia, unspecified: Secondary | ICD-10-CM | POA: Diagnosis not present

## 2022-10-17 NOTE — Telephone Encounter (Signed)
Called pt in regards to referral for psych. Per Dr Nehemiah Settle he recommends pt be seen by [facility].

## 2022-10-22 ENCOUNTER — Ambulatory Visit (INDEPENDENT_AMBULATORY_CARE_PROVIDER_SITE_OTHER): Payer: Medicare HMO | Admitting: Clinical

## 2022-10-22 DIAGNOSIS — F331 Major depressive disorder, recurrent, moderate: Secondary | ICD-10-CM | POA: Diagnosis not present

## 2022-10-22 DIAGNOSIS — F419 Anxiety disorder, unspecified: Secondary | ICD-10-CM

## 2022-10-22 NOTE — Progress Notes (Signed)
Virtual Visit via Video Note   I connected with Alexander Douglas on 10/22/22 at  2:00 PM EST by a video enabled telemedicine application and verified that I am speaking with the correct person using two identifiers.   Location: Patient: Home Provider: Office   I discussed the limitations of evaluation and management by telemedicine and the availability of in person appointments. The patient expressed understanding and agreed to proceed.   THERAPIST PROGRESS NOTE   Session Time: 2:00 PM- 2:30 PM   Participation Level: Active   Behavioral Response: Casual Alert Depressed/Anxious   Type of Therapy: Individual Therapy   Treatment Goals addressed: Coping   Interventions: CBT, Motivational Interviewing, Strength-based and Supportive   Summary: Alexander Douglas is a 51y.o. male who presents with Depression and Anxiety.The OPT therapist worked with the patient for his ongoing OPT treatment. The OPT therapist utilized Motivational Interviewing to assist in creating therapeutic repore. The OPT therapist gained feedback about the patients triggers and symptoms over the past few week.The patient spoke about ongoing difficulty with getting connected to a new med provider, the patient had his doctor do a referral to Dr. Nehemiah Settle psychiatrist who indicated to direct the patient to [facility]. The patient verbalized being reluctant to go to [facility] due to what he has heard about [facility] from others he knows in the community..The OPT therapist utilized Cognitive Behavioral Therapy through cognitive restructuring as well as worked on coping strategies to assist in management of his mental health symptoms. The patient spoke about stress around his difficulty with getting a Med Management provider for his MH. The OPT therapist worked with the patient overviewing upcoming listed [place] appointments.     Suicidal/Homicidal: Nowithout intent/plan   Therapist Response: The OPT therapist worked with the patient  for the patients scheduled session. The patient was engaged in his session and gave feedback in relation to triggers, symptoms, and behavior responses over the past few weeks. The patient spoke about his difficulty with physical health problems and transition due to having health provider office. The OPT therapist worked utilizing an in Warden/ranger Therapy exercise. The OPT therapist worked with the patient on implementation of coping skills and challenging negative thought .The OPT therapist prioritized the patient getting better with his physical health and continuing to transition to new health providers. The patient spoke about being more willing to [facility] or [place] over going to [place]. The patient noted he has an appointment with a new PCP tomorrow and will be over-viewing with them the same situation and use the PCP as a resource to get referred to another med management provider.  The patient spoke about utilizing his light box in home to manage the effects of SAD.The patient spoke about plans for the Christmas holiday in going to visit/celebrate with his sister. OPT therapist will continue treatment work with the patient in his next session.      Plan: Follow up in 2/3 weeks   Diagnosis:      Axis I: Recurrent Moderate MDD with Anxiety                          Axis II: No diagnosis   Collaboration of Care: Overview collaboration with the medication management program provided by psychiatrist Dr. Harrington Challenger   Patient/Guardian was advised Release of Information must be obtained prior to any record release in order to collaborate their care with an outside provider. Patient/Guardian was advised if they have  not already done so to contact the registration department to sign all necessary forms in order for [place] to release information regarding their care.    Consent: Patient/Guardian gives verbal consent for treatment and assignment of benefits for services provided during this  visit. Patient/Guardian expressed understanding and agreed to proceed.    I discussed the assessment and treatment plan with the patient. The patient was provided an opportunity to ask questions and all were answered. The patient agreed with the plan and demonstrated an understanding of the instructions.   The patient was advised to call back or seek an in-person evaluation if the symptoms worsen or if the condition fails to improve as anticipated.   I provided 30 minutes of non-face-to-face time during this encounter.     Maye Hides, LCSW   10/22/2022

## 2022-10-24 ENCOUNTER — Ambulatory Visit (INDEPENDENT_AMBULATORY_CARE_PROVIDER_SITE_OTHER): Payer: Medicare HMO | Admitting: Gastroenterology

## 2022-10-24 ENCOUNTER — Encounter (INDEPENDENT_AMBULATORY_CARE_PROVIDER_SITE_OTHER): Payer: Self-pay

## 2022-11-07 DIAGNOSIS — M5030 Other cervical disc degeneration, unspecified cervical region: Secondary | ICD-10-CM | POA: Diagnosis not present

## 2022-11-07 DIAGNOSIS — M47812 Spondylosis without myelopathy or radiculopathy, cervical region: Secondary | ICD-10-CM | POA: Diagnosis not present

## 2022-11-07 DIAGNOSIS — G4710 Hypersomnia, unspecified: Secondary | ICD-10-CM | POA: Diagnosis not present

## 2022-11-07 DIAGNOSIS — M4802 Spinal stenosis, cervical region: Secondary | ICD-10-CM | POA: Diagnosis not present

## 2022-11-07 DIAGNOSIS — M7918 Myalgia, other site: Secondary | ICD-10-CM | POA: Diagnosis not present

## 2022-11-07 DIAGNOSIS — F411 Generalized anxiety disorder: Secondary | ICD-10-CM | POA: Diagnosis not present

## 2022-11-07 DIAGNOSIS — M25511 Pain in right shoulder: Secondary | ICD-10-CM | POA: Diagnosis not present

## 2022-11-07 DIAGNOSIS — G8921 Chronic pain due to trauma: Secondary | ICD-10-CM | POA: Diagnosis not present

## 2022-11-07 DIAGNOSIS — F4312 Post-traumatic stress disorder, chronic: Secondary | ICD-10-CM | POA: Diagnosis not present

## 2022-11-26 DIAGNOSIS — G4720 Circadian rhythm sleep disorder, unspecified type: Secondary | ICD-10-CM | POA: Diagnosis not present

## 2022-12-02 DIAGNOSIS — F1390 Sedative, hypnotic, or anxiolytic use, unspecified, uncomplicated: Secondary | ICD-10-CM | POA: Diagnosis not present

## 2022-12-02 DIAGNOSIS — F331 Major depressive disorder, recurrent, moderate: Secondary | ICD-10-CM | POA: Diagnosis not present

## 2022-12-02 DIAGNOSIS — F411 Generalized anxiety disorder: Secondary | ICD-10-CM | POA: Diagnosis not present

## 2022-12-02 DIAGNOSIS — F410 Panic disorder [episodic paroxysmal anxiety] without agoraphobia: Secondary | ICD-10-CM | POA: Diagnosis not present

## 2022-12-02 DIAGNOSIS — G4723 Circadian rhythm sleep disorder, irregular sleep wake type: Secondary | ICD-10-CM | POA: Diagnosis not present

## 2022-12-03 DIAGNOSIS — F1390 Sedative, hypnotic, or anxiolytic use, unspecified, uncomplicated: Secondary | ICD-10-CM | POA: Diagnosis not present

## 2022-12-10 DIAGNOSIS — M546 Pain in thoracic spine: Secondary | ICD-10-CM | POA: Diagnosis not present

## 2022-12-10 DIAGNOSIS — G894 Chronic pain syndrome: Secondary | ICD-10-CM | POA: Diagnosis not present

## 2022-12-10 DIAGNOSIS — Z79899 Other long term (current) drug therapy: Secondary | ICD-10-CM | POA: Diagnosis not present

## 2022-12-10 DIAGNOSIS — M542 Cervicalgia: Secondary | ICD-10-CM | POA: Diagnosis not present

## 2022-12-10 DIAGNOSIS — Z79891 Long term (current) use of opiate analgesic: Secondary | ICD-10-CM | POA: Diagnosis not present

## 2022-12-10 DIAGNOSIS — M722 Plantar fascial fibromatosis: Secondary | ICD-10-CM | POA: Diagnosis not present

## 2022-12-10 DIAGNOSIS — M25511 Pain in right shoulder: Secondary | ICD-10-CM | POA: Diagnosis not present

## 2023-09-05 IMAGING — MR MR CERVICAL SPINE W/O CM
5 series · 39 of 48 positions shown · non-contrast
Comparison: CT myelogram 04/11/2014

CLINICAL DATA: Cervical stenosis.  No known injury.

EXAM:
MRI CERVICAL SPINE WITHOUT CONTRAST
TECHNIQUE: Multiplanar, multisequence MR imaging of the cervical spine was
performed. No intravenous contrast was administered.

[Series 5: T2 · sagittal · 3.0mm · 0.69mm/px · 6 of 15 slices shown (1 of 2)]
[im 1/15]
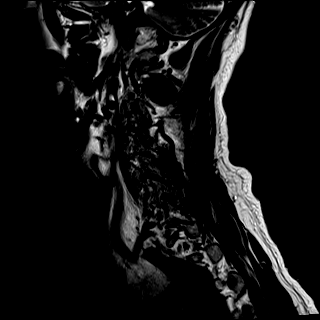
[im 3/15]
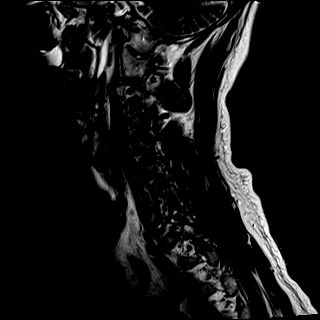
[im 6/15]
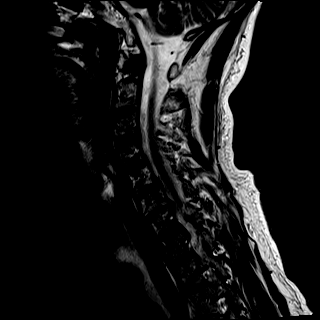
[im 9/15]
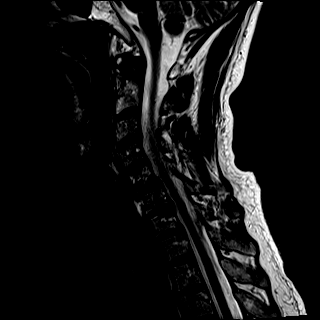
[im 12/15]
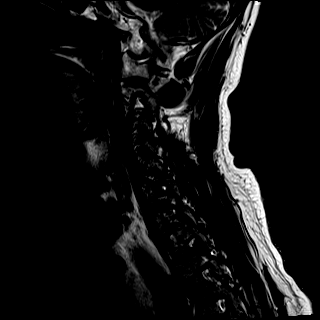
[im 15/15]
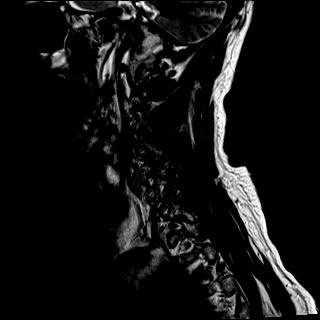

[Series 6: T1 · sagittal · 3.0mm · 0.86mm/px · 6 of 15 slices shown]
[im 1/15]
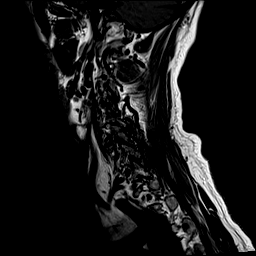
[im 3/15]
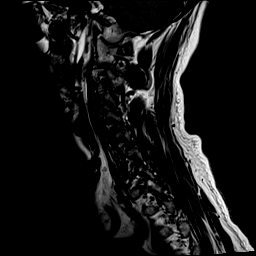
[im 6/15]
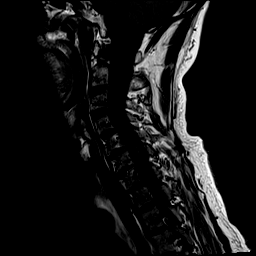
[im 9/15]
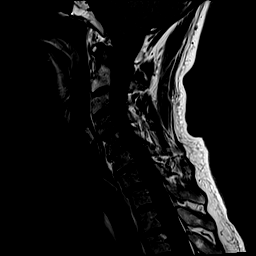
[im 12/15]
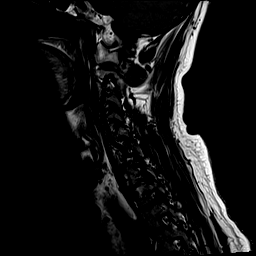
[im 15/15]
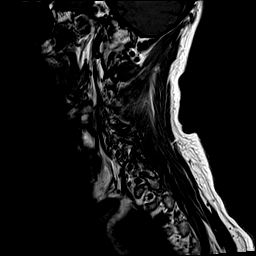

[Series 7: STIR · sagittal · 3.0mm · 0.69mm/px · 6 of 15 slices shown]
[im 1/15]
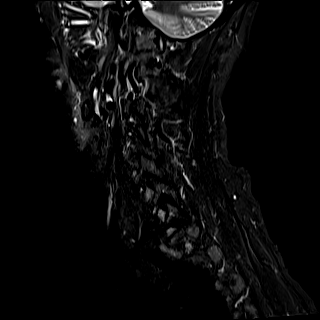
[im 3/15]
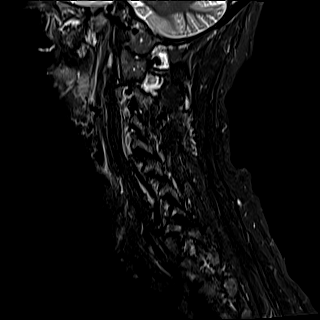
[im 6/15]
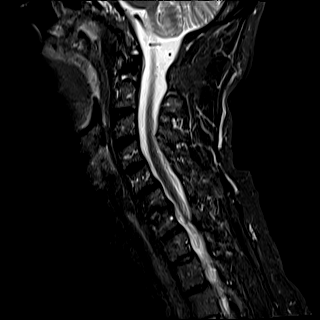
[im 9/15]
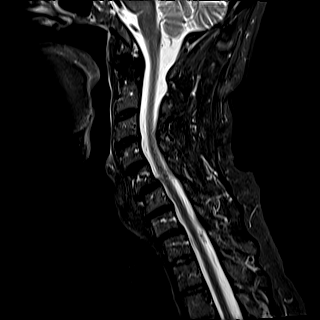
[im 12/15]
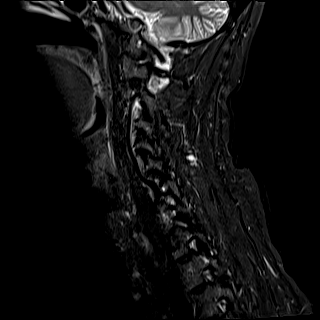
[im 15/15]
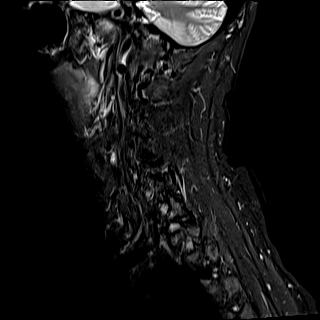

[Series 8: T2 · axial · 3.0mm · 0.70mm/px · z∈[-105,+17]mm · 13 of 38 slices shown (2 of 2)]
[im 1/38]
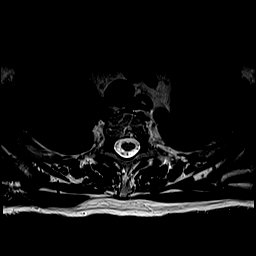
[im 3/38]
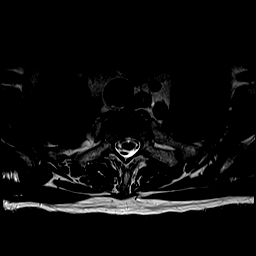
[im 6/38]
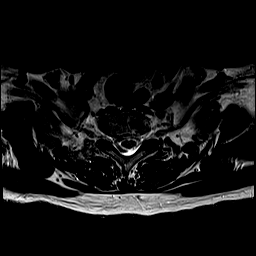
[im 8/38]
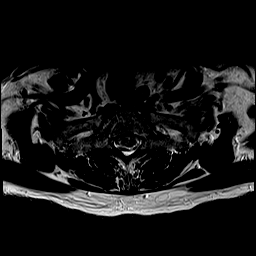
[im 11/38]
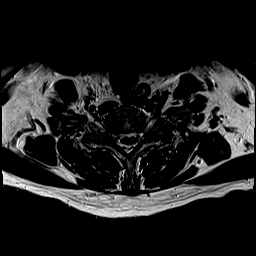
[im 14/38]
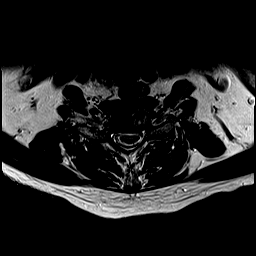
[im 16/38]
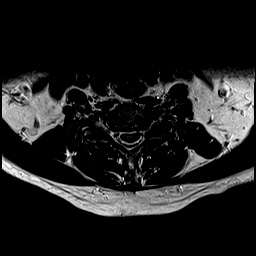
[im 19/38]
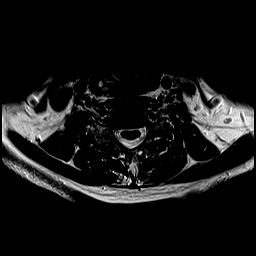
[im 22/38]
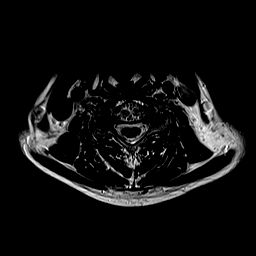
[im 24/38]
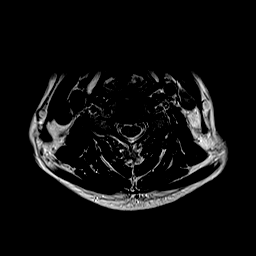
[im 27/38]
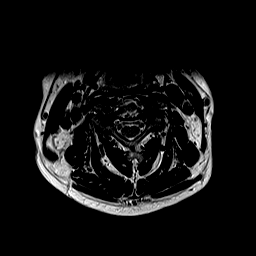
[im 32/38]
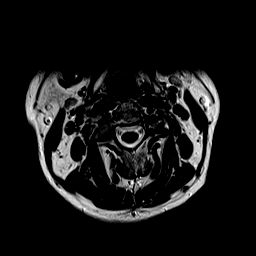
[im 38/38]
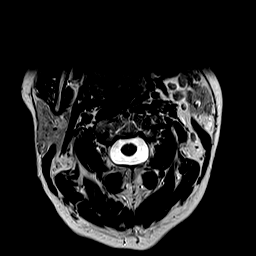

[Series 9: GRE · axial · 3.0mm · 0.35mm/px · z∈[-105,+17]mm · 8 of 38 slices shown]
[im 1/38]
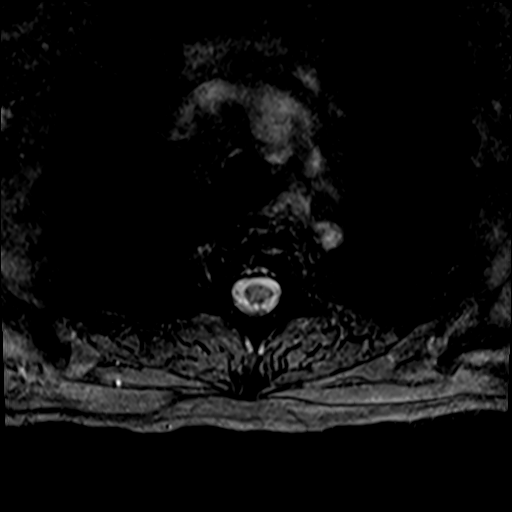
[im 6/38]
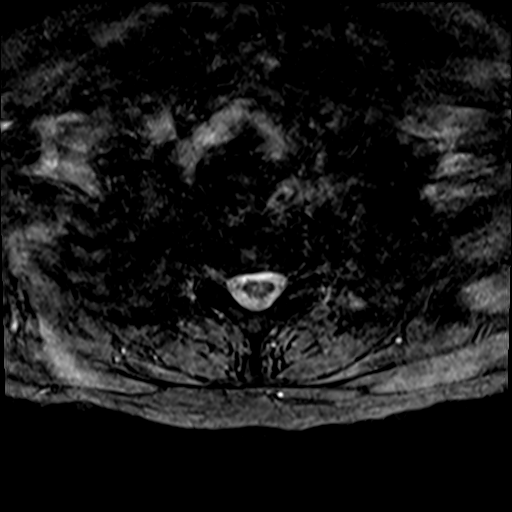
[im 11/38]
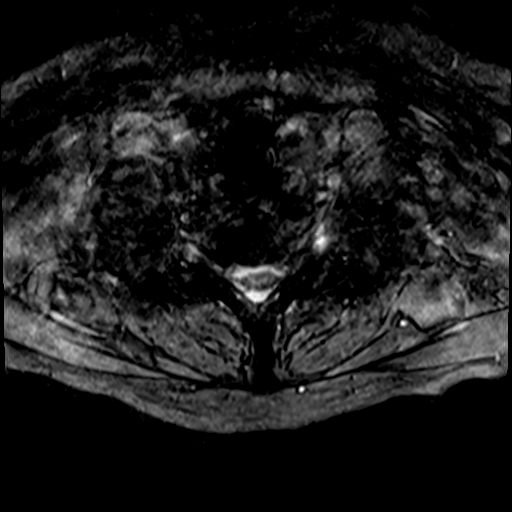
[im 16/38]
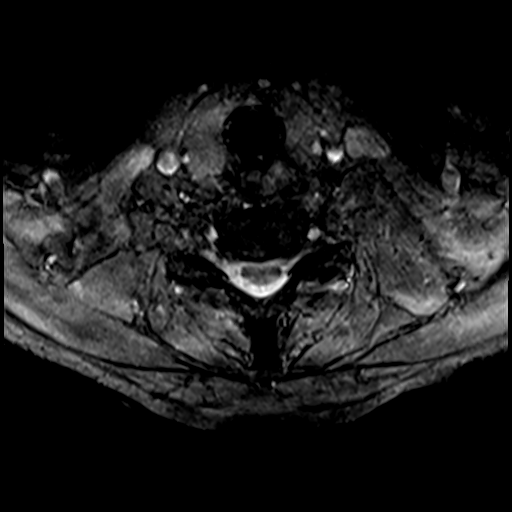
[im 22/38]
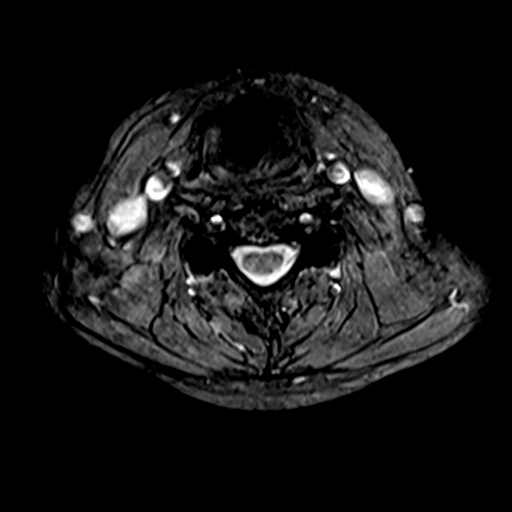
[im 27/38]
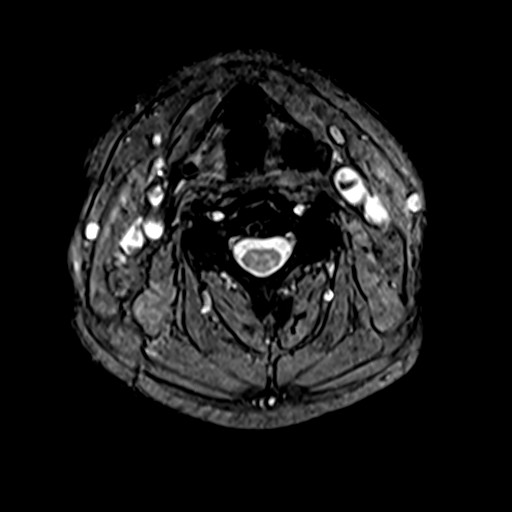
[im 32/38]
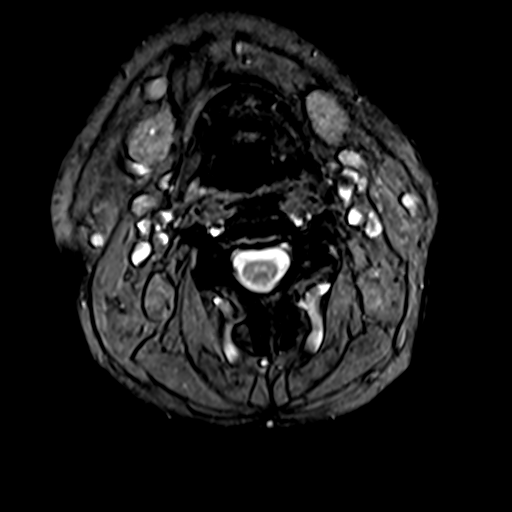
[im 38/38]
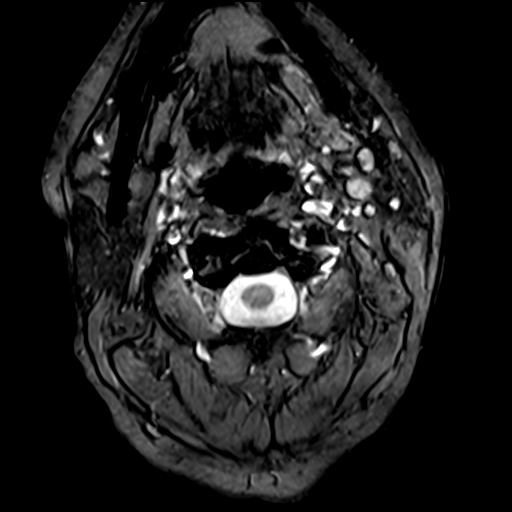

[39 of 48 positions shown; findings below may reference images not displayed]

FINDINGS: Alignment: Grade 1 anterolisthesis of C5-6.

Vertebrae: No fracture, evidence of discitis, or bone lesion.

Cord: Normal signal and morphology.

Posterior Fossa, vertebral arteries, paraspinal tissues: Negative.

Disc levels:

C2-C3: No disc protrusion. Mild left-sided facet arthropathy results
in mild left foraminal stenosis. No canal stenosis.

C3-C4: Minimal disc osteophyte complex with mild bilateral facet
arthropathy and minimal uncovertebral spurring. Findings result in
moderate bilateral foraminal stenosis. No canal stenosis.

C4-C5: No significant disc protrusion. Mild facet and uncovertebral
arthropathy without significant foraminal stenosis or canal
stenosis.

C5-C6: Mild disc uncovering with mild facet and uncovertebral
arthropathy. No significant foraminal stenosis. No canal stenosis.

C6-C7: Mild disc osteophyte complex with bilateral uncovertebral
spurring. Findings result in moderate left and mild right foraminal
stenosis. No canal stenosis.

C7-T1: Disc osteophyte complex with bilateral facet arthropathy and
right greater than left uncovertebral spurring. Findings result in
moderate right-sided foraminal stenosis. No significant canal
stenosis.
IMPRESSION: 1. Multilevel degenerative changes of the cervical spine as
described above.
2. No significant canal stenosis at any level.
3. Moderate bilateral foraminal stenosis at C3-4. Moderate left and
mild right foraminal stenosis at C6-7. Moderate right-sided
foraminal stenosis at C7-T1.

## 2023-12-26 ENCOUNTER — Encounter (INDEPENDENT_AMBULATORY_CARE_PROVIDER_SITE_OTHER): Payer: Self-pay | Admitting: *Deleted

## 2024-08-25 ENCOUNTER — Encounter (INDEPENDENT_AMBULATORY_CARE_PROVIDER_SITE_OTHER): Payer: Self-pay | Admitting: Gastroenterology
